# Patient Record
Sex: Male | Born: 1955 | Race: White | Hispanic: No | Marital: Married | Smoking: Former smoker
Health system: Southern US, Community
[De-identification: ages and names within clinical notes are randomized; demographics above are authoritative.]

## PROBLEM LIST (undated history)

## (undated) DIAGNOSIS — N200 Calculus of kidney: Secondary | ICD-10-CM

## (undated) DIAGNOSIS — K529 Noninfective gastroenteritis and colitis, unspecified: Secondary | ICD-10-CM

## (undated) DIAGNOSIS — K4090 Unilateral inguinal hernia, without obstruction or gangrene, not specified as recurrent: Secondary | ICD-10-CM

## (undated) DIAGNOSIS — Z8489 Family history of other specified conditions: Secondary | ICD-10-CM

## (undated) DIAGNOSIS — E785 Hyperlipidemia, unspecified: Secondary | ICD-10-CM

## (undated) DIAGNOSIS — M47812 Spondylosis without myelopathy or radiculopathy, cervical region: Secondary | ICD-10-CM

## (undated) DIAGNOSIS — M1990 Unspecified osteoarthritis, unspecified site: Secondary | ICD-10-CM

## (undated) DIAGNOSIS — M47816 Spondylosis without myelopathy or radiculopathy, lumbar region: Secondary | ICD-10-CM

## (undated) DIAGNOSIS — E119 Type 2 diabetes mellitus without complications: Secondary | ICD-10-CM

## (undated) HISTORY — DX: Calculus of kidney: N20.0

## (undated) HISTORY — PX: BACK SURGERY: SHX140

## (undated) HISTORY — DX: Spondylosis without myelopathy or radiculopathy, lumbar region: M47.816

## (undated) HISTORY — DX: Spondylosis without myelopathy or radiculopathy, cervical region: M47.812

## (undated) HISTORY — PX: NECK SURGERY: SHX720

## (undated) HISTORY — DX: Type 2 diabetes mellitus without complications: E11.9

## (undated) HISTORY — PX: KIDNEY STONE SURGERY: SHX686

## (undated) HISTORY — DX: Noninfective gastroenteritis and colitis, unspecified: K52.9

## (undated) HISTORY — DX: Unspecified osteoarthritis, unspecified site: M19.90

## (undated) HISTORY — DX: Hyperlipidemia, unspecified: E78.5

## (undated) HISTORY — DX: Unilateral inguinal hernia, without obstruction or gangrene, not specified as recurrent: K40.90

---
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
## 1996-09-22 HISTORY — PX: CERVICAL FUSION: SHX112

## 1997-09-22 HISTORY — PX: SEPTOPLASTY: SUR1290

## 2001-01-20 DIAGNOSIS — M1990 Unspecified osteoarthritis, unspecified site: Secondary | ICD-10-CM | POA: Insufficient documentation

## 2004-05-23 ENCOUNTER — Encounter: Payer: Self-pay | Admitting: Family Medicine

## 2004-05-23 DIAGNOSIS — E785 Hyperlipidemia, unspecified: Secondary | ICD-10-CM

## 2004-05-23 HISTORY — DX: Hyperlipidemia, unspecified: E78.5

## 2004-05-23 LAB — CONVERTED CEMR LAB: PSA: 0.3 ng/mL

## 2004-08-22 ENCOUNTER — Encounter: Payer: Self-pay | Admitting: Family Medicine

## 2004-08-22 DIAGNOSIS — E119 Type 2 diabetes mellitus without complications: Secondary | ICD-10-CM | POA: Insufficient documentation

## 2004-08-22 HISTORY — DX: Type 2 diabetes mellitus without complications: E11.9

## 2004-08-22 LAB — CONVERTED CEMR LAB: Hgb A1c MFr Bld: 6.1 %

## 2004-09-09 ENCOUNTER — Ambulatory Visit: Payer: Self-pay | Admitting: Family Medicine

## 2004-09-11 ENCOUNTER — Ambulatory Visit: Payer: Self-pay | Admitting: Family Medicine

## 2004-10-15 ENCOUNTER — Ambulatory Visit: Payer: Self-pay | Admitting: Family Medicine

## 2004-11-20 ENCOUNTER — Encounter: Payer: Self-pay | Admitting: Family Medicine

## 2004-11-20 LAB — CONVERTED CEMR LAB: PSA: 0.35 ng/mL

## 2004-12-09 ENCOUNTER — Ambulatory Visit: Payer: Self-pay | Admitting: Family Medicine

## 2004-12-11 ENCOUNTER — Ambulatory Visit: Payer: Self-pay | Admitting: Family Medicine

## 2005-06-16 ENCOUNTER — Ambulatory Visit: Payer: Self-pay | Admitting: Family Medicine

## 2005-07-16 ENCOUNTER — Ambulatory Visit: Payer: Self-pay | Admitting: Family Medicine

## 2005-11-20 ENCOUNTER — Encounter: Payer: Self-pay | Admitting: Family Medicine

## 2005-11-20 LAB — CONVERTED CEMR LAB: PSA: 0.45 ng/mL

## 2005-12-08 ENCOUNTER — Ambulatory Visit: Payer: Self-pay | Admitting: Family Medicine

## 2005-12-15 ENCOUNTER — Ambulatory Visit: Payer: Self-pay | Admitting: Family Medicine

## 2006-01-14 ENCOUNTER — Ambulatory Visit: Payer: Self-pay | Admitting: Family Medicine

## 2006-02-02 ENCOUNTER — Ambulatory Visit: Payer: Self-pay | Admitting: Family Medicine

## 2006-02-05 ENCOUNTER — Ambulatory Visit: Payer: Self-pay | Admitting: Family Medicine

## 2006-03-09 ENCOUNTER — Ambulatory Visit: Payer: Self-pay | Admitting: Family Medicine

## 2006-03-16 ENCOUNTER — Ambulatory Visit: Payer: Self-pay | Admitting: Family Medicine

## 2006-04-27 ENCOUNTER — Ambulatory Visit: Payer: Self-pay | Admitting: Family Medicine

## 2006-05-12 ENCOUNTER — Ambulatory Visit: Payer: Self-pay | Admitting: Family Medicine

## 2006-06-23 ENCOUNTER — Ambulatory Visit: Payer: Self-pay | Admitting: Family Medicine

## 2006-08-07 ENCOUNTER — Ambulatory Visit: Payer: Self-pay | Admitting: Family Medicine

## 2006-08-11 ENCOUNTER — Ambulatory Visit: Payer: Self-pay | Admitting: Family Medicine

## 2006-11-21 ENCOUNTER — Encounter: Payer: Self-pay | Admitting: Family Medicine

## 2006-12-18 ENCOUNTER — Ambulatory Visit: Payer: Self-pay | Admitting: Family Medicine

## 2006-12-18 LAB — CONVERTED CEMR LAB
ALT: 26 units/L (ref 0–40)
AST: 19 units/L (ref 0–37)
Bilirubin, Direct: 0.1 mg/dL (ref 0.0–0.3)
Calcium: 9.2 mg/dL (ref 8.4–10.5)
Chloride: 106 meq/L (ref 96–112)
Cholesterol: 114 mg/dL (ref 0–200)
GFR calc non Af Amer: 84 mL/min
Glucose, Bld: 127 mg/dL — ABNORMAL HIGH (ref 70–99)
Hgb A1c MFr Bld: 6.3 % — ABNORMAL HIGH (ref 4.6–6.0)
LDL Cholesterol: 65 mg/dL (ref 0–99)
PSA: 0.21 ng/mL (ref 0.10–4.00)

## 2006-12-21 ENCOUNTER — Ambulatory Visit: Payer: Self-pay | Admitting: Family Medicine

## 2006-12-22 ENCOUNTER — Encounter: Payer: Self-pay | Admitting: Family Medicine

## 2007-01-04 ENCOUNTER — Ambulatory Visit: Payer: Self-pay | Admitting: Family Medicine

## 2007-06-07 ENCOUNTER — Encounter: Payer: Self-pay | Admitting: Family Medicine

## 2007-06-18 ENCOUNTER — Ambulatory Visit: Payer: Self-pay | Admitting: Internal Medicine

## 2007-06-21 LAB — CONVERTED CEMR LAB: Hgb A1c MFr Bld: 6.5 % — ABNORMAL HIGH (ref 4.6–6.0)

## 2007-06-29 ENCOUNTER — Ambulatory Visit: Payer: Self-pay | Admitting: Family Medicine

## 2007-09-21 ENCOUNTER — Ambulatory Visit: Payer: Self-pay | Admitting: Family Medicine

## 2007-09-23 HISTORY — PX: FOOT SURGERY: SHX648

## 2007-12-27 ENCOUNTER — Ambulatory Visit: Payer: Self-pay | Admitting: Family Medicine

## 2007-12-27 LAB — CONVERTED CEMR LAB
ALT: 33 units/L (ref 0–53)
BUN: 15 mg/dL (ref 6–23)
CO2: 29 meq/L (ref 19–32)
Calcium: 9.2 mg/dL (ref 8.4–10.5)
Cholesterol: 116 mg/dL (ref 0–200)
Creatinine, Ser: 1 mg/dL (ref 0.4–1.5)
Creatinine,U: 21.1 mg/dL
Glucose, Bld: 120 mg/dL — ABNORMAL HIGH (ref 70–99)
HDL: 32.7 mg/dL — ABNORMAL LOW (ref >39.0)
Microalb, Ur: 0.2 mg/dL (ref 0.0–1.9)
TSH: 1.24 microintl units/mL (ref 0.35–5.50)
Total Protein: 6.4 g/dL (ref 6.0–8.3)
Triglycerides: 52 mg/dL (ref 0–149)

## 2007-12-29 ENCOUNTER — Ambulatory Visit: Payer: Self-pay | Admitting: Family Medicine

## 2008-01-20 ENCOUNTER — Ambulatory Visit: Payer: Self-pay | Admitting: Family Medicine

## 2008-01-20 ENCOUNTER — Encounter (INDEPENDENT_AMBULATORY_CARE_PROVIDER_SITE_OTHER): Payer: Self-pay | Admitting: *Deleted

## 2008-01-20 LAB — CONVERTED CEMR LAB
OCCULT 2: NEGATIVE
OCCULT 3: NEGATIVE

## 2008-06-07 LAB — HM DIABETES EYE EXAM: HM Diabetic Eye Exam: NORMAL

## 2008-06-16 ENCOUNTER — Ambulatory Visit: Payer: Self-pay | Admitting: Family Medicine

## 2008-07-12 ENCOUNTER — Ambulatory Visit: Payer: Self-pay | Admitting: Family Medicine

## 2008-07-12 LAB — CONVERTED CEMR LAB: Hgb A1c MFr Bld: 6.3 % — ABNORMAL HIGH (ref 4.6–6.0)

## 2008-07-19 ENCOUNTER — Ambulatory Visit: Payer: Self-pay | Admitting: Family Medicine

## 2008-08-29 ENCOUNTER — Ambulatory Visit: Payer: Self-pay | Admitting: Family Medicine

## 2008-08-30 ENCOUNTER — Telehealth (INDEPENDENT_AMBULATORY_CARE_PROVIDER_SITE_OTHER): Payer: Self-pay | Admitting: *Deleted

## 2008-10-02 ENCOUNTER — Ambulatory Visit: Payer: Self-pay | Admitting: Family Medicine

## 2009-01-10 ENCOUNTER — Ambulatory Visit: Payer: Self-pay | Admitting: Family Medicine

## 2009-01-10 LAB — CONVERTED CEMR LAB
ALT: 31 units/L (ref 0–53)
AST: 23 units/L (ref 0–37)
Alkaline Phosphatase: 51 units/L (ref 39–117)
Basophils Relative: 0 % (ref 0.0–3.0)
Bilirubin, Direct: 0 mg/dL (ref 0.0–0.3)
Chloride: 109 meq/L (ref 96–112)
Creatinine, Ser: 0.9 mg/dL (ref 0.4–1.5)
Creatinine,U: 22.1 mg/dL
Eosinophils Relative: 3.4 % (ref 0.0–5.0)
GFR calc non Af Amer: 93.46 mL/min (ref >60)
LDL Cholesterol: 68 mg/dL (ref 0–99)
Lymphocytes Relative: 29.5 % (ref 12.0–46.0)
MCV: 93.4 fL (ref 78.0–100.0)
Microalb Creat Ratio: 9 mg/g (ref 0.0–30.0)
Monocytes Absolute: 0.5 10*3/uL (ref 0.1–1.0)
Monocytes Relative: 8.9 % (ref 3.0–12.0)
Neutrophils Relative %: 58.2 % (ref 43.0–77.0)
Platelets: 247 10*3/uL (ref 150.0–400.0)
RBC: 4.58 M/uL (ref 4.22–5.81)
Total Bilirubin: 0.7 mg/dL (ref 0.3–1.2)
Total CHOL/HDL Ratio: 3
Total Protein: 6.7 g/dL (ref 6.0–8.3)
Triglycerides: 63 mg/dL (ref 0.0–149.0)
VLDL: 12.6 mg/dL (ref 0.0–40.0)
WBC: 5.4 10*3/uL (ref 4.5–10.5)

## 2009-01-15 ENCOUNTER — Ambulatory Visit: Payer: Self-pay | Admitting: Family Medicine

## 2009-01-15 LAB — HM DIABETES FOOT EXAM

## 2009-01-25 ENCOUNTER — Ambulatory Visit: Payer: Self-pay | Admitting: Family Medicine

## 2009-01-25 LAB — FECAL OCCULT BLOOD, GUAIAC: Fecal Occult Blood: NEGATIVE

## 2009-01-25 LAB — CONVERTED CEMR LAB: OCCULT 3: NEGATIVE

## 2009-01-29 ENCOUNTER — Encounter (INDEPENDENT_AMBULATORY_CARE_PROVIDER_SITE_OTHER): Payer: Self-pay | Admitting: *Deleted

## 2009-07-17 ENCOUNTER — Ambulatory Visit: Payer: Self-pay | Admitting: Family Medicine

## 2009-07-17 LAB — CONVERTED CEMR LAB: Hgb A1c MFr Bld: 6.2 % (ref 4.6–6.5)

## 2009-07-24 ENCOUNTER — Ambulatory Visit: Payer: Self-pay | Admitting: Family Medicine

## 2010-01-29 ENCOUNTER — Ambulatory Visit: Payer: Self-pay | Admitting: Family Medicine

## 2010-04-30 ENCOUNTER — Encounter (INDEPENDENT_AMBULATORY_CARE_PROVIDER_SITE_OTHER): Payer: Self-pay | Admitting: *Deleted

## 2010-06-27 ENCOUNTER — Telehealth (INDEPENDENT_AMBULATORY_CARE_PROVIDER_SITE_OTHER): Payer: Self-pay | Admitting: *Deleted

## 2010-06-27 ENCOUNTER — Ambulatory Visit: Payer: Self-pay | Admitting: Family Medicine

## 2010-06-27 LAB — CONVERTED CEMR LAB
AST: 21 units/L (ref 0–37)
Albumin: 4.2 g/dL (ref 3.5–5.2)
Alkaline Phosphatase: 52 units/L (ref 39–117)
Basophils Relative: 0.4 % (ref 0.0–3.0)
Bilirubin, Direct: 0.1 mg/dL (ref 0.0–0.3)
CO2: 29 meq/L (ref 19–32)
Chloride: 102 meq/L (ref 96–112)
Creatinine, Ser: 1 mg/dL (ref 0.4–1.5)
Eosinophils Absolute: 0.2 10*3/uL (ref 0.0–0.7)
HCT: 42.8 % (ref 39.0–52.0)
Hemoglobin: 14.6 g/dL (ref 13.0–17.0)
Hgb A1c MFr Bld: 7 % — ABNORMAL HIGH (ref 4.6–6.5)
MCHC: 34.2 g/dL (ref 30.0–36.0)
MCV: 94.3 fL (ref 78.0–100.0)
Microalb Creat Ratio: 0.9 mg/g (ref 0.0–30.0)
Monocytes Absolute: 0.6 10*3/uL (ref 0.1–1.0)
Neutrophils Relative %: 59.2 % (ref 43.0–77.0)
PSA: 0.39 ng/mL (ref 0.10–4.00)
Platelets: 249 10*3/uL (ref 150.0–400.0)
Potassium: 4.6 meq/L (ref 3.5–5.1)
Sodium: 138 meq/L (ref 135–145)
Total Bilirubin: 0.6 mg/dL (ref 0.3–1.2)
Total CHOL/HDL Ratio: 3
Triglycerides: 83 mg/dL (ref 0.0–149.0)
WBC: 6.6 10*3/uL (ref 4.5–10.5)

## 2010-07-03 ENCOUNTER — Encounter (INDEPENDENT_AMBULATORY_CARE_PROVIDER_SITE_OTHER): Payer: Self-pay | Admitting: *Deleted

## 2010-07-03 ENCOUNTER — Ambulatory Visit: Payer: Self-pay | Admitting: Family Medicine

## 2010-07-03 DIAGNOSIS — K649 Unspecified hemorrhoids: Secondary | ICD-10-CM | POA: Insufficient documentation

## 2010-07-09 ENCOUNTER — Encounter (INDEPENDENT_AMBULATORY_CARE_PROVIDER_SITE_OTHER): Payer: Self-pay | Admitting: *Deleted

## 2010-07-10 ENCOUNTER — Ambulatory Visit: Payer: Self-pay | Admitting: Gastroenterology

## 2010-07-10 ENCOUNTER — Encounter (INDEPENDENT_AMBULATORY_CARE_PROVIDER_SITE_OTHER): Payer: Self-pay | Admitting: *Deleted

## 2010-07-17 ENCOUNTER — Ambulatory Visit: Payer: Self-pay | Admitting: Gastroenterology

## 2010-09-30 ENCOUNTER — Encounter: Payer: Self-pay | Admitting: Family Medicine

## 2010-09-30 ENCOUNTER — Ambulatory Visit
Admission: RE | Admit: 2010-09-30 | Discharge: 2010-09-30 | Payer: Self-pay | Source: Home / Self Care | Attending: Family Medicine | Admitting: Family Medicine

## 2010-09-30 ENCOUNTER — Ambulatory Visit: Payer: Self-pay | Admitting: Family Medicine

## 2010-09-30 DIAGNOSIS — M5137 Other intervertebral disc degeneration, lumbosacral region: Secondary | ICD-10-CM | POA: Insufficient documentation

## 2010-10-22 NOTE — Letter (Signed)
Summary: Gary Everett Instructions  Gary Everett  [address] [place], [place] [address]   Phone: [phone]  Fax: [phone]       Gary Everett    Oct 07, 1955    MRN: [id number]        Procedure Day Dorna Bloom:  Gary Everett  07/17/10     Arrival Time:  8:00AM     Procedure Time:  9:00AM     Location of Procedure:                    Gary Everett  Gary Everett (4th Floor)                       PREPARATION FOR COLONOSCOPY WITH MOVIPREP   Starting 5 days prior to your procedure 07/12/10 do not eat nuts, seeds, popcorn, corn, beans, peas,  salads, or any raw vegetables.  Do not take any fiber supplements (e.g. Metamucil, Citrucel, and Benefiber).  THE DAY BEFORE YOUR PROCEDURE         DATE: 07/16/10  DAY: TUESDAY  1.  Drink clear liquids the entire day-NO SOLID FOOD  2.  Do not drink anything colored red or purple.  Avoid juices with pulp.  No orange juice.  3.  Drink at least 64 oz. (8 glasses) of fluid/clear liquids during the day to prevent dehydration and help the prep work efficiently.  CLEAR LIQUIDS INCLUDE: Water Jello Ice Popsicles Tea (sugar ok, no milk/cream) Powdered fruit flavored drinks Coffee (sugar ok, no milk/cream) Gatorade Juice: apple, white grape, white cranberry  Lemonade Clear bullion, consomm, broth Carbonated beverages (any kind) Strained chicken noodle soup Hard Candy                             4.  In the morning, mix first dose of MoviPrep solution:    Empty 1 Pouch A and 1 Pouch B into the disposable container    Add lukewarm drinking water to the top line of the container. Mix to dissolve    Refrigerate (mixed solution should be used within 24 hrs)  5.  Begin drinking the prep at 5:00 p.m. The MoviPrep container is divided by 4 marks.   Every 15 minutes drink the solution down to the next mark (approximately 8 oz) until the full liter is complete.   6.  Follow completed prep with 16 oz of clear liquid of your choice (Nothing  red or purple).  Continue to drink clear liquids until bedtime.  7.  Before going to bed, mix second dose of MoviPrep solution:    Empty 1 Pouch A and 1 Pouch B into the disposable container    Add lukewarm drinking water to the top line of the container. Mix to dissolve    Refrigerate  THE DAY OF YOUR PROCEDURE      DATE: 07/17/10   DAY: WEDNESDAY  Beginning at 4:00AM (5 hours before procedure):         1. Every 15 minutes, drink the solution down to the next mark (approx 8 oz) until the full liter is complete.  2. Follow completed prep with 16 oz. of clear liquid of your choice.    3. You may drink clear liquids until 7:00AM (2 HOURS BEFORE PROCEDURE).   MEDICATION INSTRUCTIONS  Unless otherwise instructed, you should take regular prescription medications with a small sip of water   as early as possible the morning  of your procedure.  See separate diabetic instructions:          OTHER INSTRUCTIONS  You will need a responsible adult at least 56 years of age to accompany you and drive you home.   This person must remain in the waiting room during your procedure.  Wear loose fitting clothing that is easily removed.  Leave jewelry and other valuables at home.  However, you may wish to bring a book to read or  an iPod/MP3 player to listen to music as you wait for your procedure to start.  Remove all body piercing jewelry and leave at home.  Total time from sign-in until discharge is approximately 2-3 hours.  You should go home directly after your procedure and rest.  You can resume normal activities the  day after your procedure.  The day of your procedure you should not:   Drive   Make legal decisions   Operate machinery   Drink alcohol   Return to work  You will receive specific instructions about eating, activities and medications before you leave.    The above instructions have been reviewed and explained to me by   Gary Almas RN  July 10, 2010 3:58 PM     I fully understand and can verbalize these instructions _____________________________ Date _________

## 2010-10-22 NOTE — Miscellaneous (Signed)
Summary: [facility] Previsit/prep  Clinical Lists Changes  Medications: Added new medication of MOVIPREP 100 GM  SOLR (PEG-KCL-NACL-NASULF-NA ASC-C) As per prep instructions. - Signed Rx of MOVIPREP 100 GM  SOLR (PEG-KCL-NACL-NASULF-NA ASC-C) As per prep instructions.;  #1 x 0;  Signed;  Entered by: Wyona Almas RN;  Authorized by: Mardella Layman MD [facility];  Method used: Electronically to [facility]. [facility] [phone]*, [address] [place], [place]  [id number], Ph: [phone], Fax: [phone] Allergies: Changed allergy or adverse reaction from * IVP DYE to * IVP DYE    Prescriptions: MOVIPREP 100 GM  SOLR (PEG-KCL-NACL-NASULF-NA ASC-C) As per prep instructions.  #1 x 0   Entered by:   Wyona Almas RN   Authorized by:   Mardella Layman MD [facility]   Signed by:   Wyona Almas RN on 07/10/2010   Method used:   Electronically to        [facility]. [address] [phone]* (retail)       [address] [place], [place]  [id number]       Ph: [phone]       Fax: [phone]   RxID:   [id number]

## 2010-10-22 NOTE — Assessment & Plan Note (Signed)
Summary: ROA 6 MTHS  CYD   Vital Signs:  Patient profile:   55 year old male Weight:      178.75 pounds BMI:     28.10 Temp:     97.8 degrees F oral Pulse rate:   72 / minute Pulse rhythm:   regular BP sitting:   110 / 64  (left arm) Cuff size:   regular  Vitals Entered By: Sydell Axon LPN (Jan 29, 2010 8:07 AM) CC: 6 Month follow-up, had some swelling and soreness in left breast area last week, better now   History of Present Illness: Pt here for 6 month followup. Last week he had some swelling around the areola of the left breast and responded to heat therapy and has now resolved. Sugar runs 137-114, higher in morning. He sees he thinks increase in glu in AM if snacks at night. These nos are stable. He feels well today with no complaints.  Problems Prior to Update: 1)  Special Screening Malig Neoplasms Other Sites  (ICD-V76.49) 2)  Health Maintenance Exam  (ICD-V70.0) 3)  Special Screening Malignant Neoplasm of Prostate  (ICD-V76.44) 4)  Osteoarthrosis Nos, Unspecified Site  (ICD-715.90) 5)  Nicotine Addiction  (ICD-305.1) 6)  Diabetes Mellitus, Type II  (ICD-250.00) 7)  Hyperlipidemia  (ICD-272.4)  Medications Prior to Update: 1)  Simvastatin 40 Mg  Tabs (Simvastatin) .... Take 1 Tablet By Mouth At Bedtime 2)  Tylenol Ex St Arthritis Pain 500 Mg  Tabs (Acetaminophen) .... As Needed 3)  Metformin Hcl 500 Mg  Tabs (Metformin Hcl) .... One Tab By Mouth At Night. 4)  Accu-Chek Comfort Curve  Strp (Glucose Blood) .... Check Daily As Directed  Allergies: 1)  ! * Ivp Dye  Physical Exam  General:  Well-developed,well-nourished,in no acute distress; alert,appropriate and cooperative throughout examination Head:  Normocephalic and atraumatic without obvious abnormalities. No apparent alopecia but mild  balding. Eyes:  Conjunctiva clear bilaterally.  Ears:  External ear exam shows no significant lesions or deformities.  Otoscopic examination reveals clear canals, tympanic  membranes are intact bilaterally without bulging, retraction, inflammation or discharge. Hearing is grossly normal bilaterally. Nose:  External nasal examination shows no deformity or inflammation. Nasal mucosa are pink and moist without lesions or exudates. Mouth:  Oral mucosa and oropharynx without lesions or exudates.  Teeth in good repair. Neck:  No deformities, masses, or tenderness noted. Lungs:  Normal respiratory effort, chest expands symmetrically. Lungs are clear to auscultation, no crackles or wheezes. Heart:  Normal rate and regular rhythm. S1 and S2 normal without gallop, murmur, click, rub or other extra sounds.   Impression & Recommendations:  Problem # 1:  DIABETES MELLITUS, TYPE II (ICD-250.00) Assessment Unchanged  Stable. Will follow. Cont curr meds. His updated medication list for this problem includes:    Metformin Hcl 500 Mg Tabs (Metformin hcl) ..... One tab by mouth at night.  Labs Reviewed: Creat: 0.9 (01/10/2009)   Microalbumin: 6.5 (11/20/2005)  Last Eye Exam: normal (06/07/2008) Reviewed HgBA1c results: 6.2 (07/17/2009)  6.3 (01/10/2009)  Complete Medication List: 1)  Simvastatin 40 Mg Tabs (Simvastatin) .... Take 1 tablet by mouth at bedtime 2)  Tylenol Ex St Arthritis Pain 500 Mg Tabs (Acetaminophen) .... As needed 3)  Metformin Hcl 500 Mg Tabs (Metformin hcl) .... One tab by mouth at night. 4)  Accu-chek Comfort Curve Strp (Glucose blood) .... Check daily as directed  Patient Instructions: 1)  RTC 10/11 for Comp Exam, labs prior. Call the end of the month.  Current Allergies (reviewed today): ! * IVP DYE

## 2010-10-22 NOTE — Procedures (Signed)
Summary: Colonoscopy  Patient: Gary Everett Note: All result statuses are Final unless otherwise noted.  Tests: (1) Colonoscopy (COL)   COL Colonoscopy           DONE (C)     [facility]     [address]     [place], [place]  [address]           COLONOSCOPY PROCEDURE REPORT           PATIENT:  [name], [name]  MR#:  [id number]     BIRTHDATE:  January 10, 1956, 54 yrs. old  GENDER:  male     ENDOSCOPIST:  Vania Rea. Jarold Motto, MD, [facility]     REF. BY:  Laurita Quint, M.D.     PROCEDURE DATE:  07/17/2010     PROCEDURE:  Average-risk screening colonoscopy     G0121     ASA CLASS:  Class II     INDICATIONS:  Routine Risk Screening     MEDICATIONS:   Fentanyl 50 mcg IV, Versed 6 mg IV           DESCRIPTION OF PROCEDURE:   After the risks benefits and     alternatives of the procedure were thoroughly explained, informed     consent was obtained.  Digital rectal exam was performed and     revealed no abnormalities.   The LB [id number] endoscope     was introduced through the anus and advanced to the cecum, which     was identified by both the appendix and ileocecal valve, without     limitations.  The quality of the prep was excellent, using     MoviPrep.  The instrument was then slowly withdrawn as the colon     was fully examined.     <<PROCEDUREIMAGES>>           FINDINGS:  Scattered diverticula were found in the sigmoid colon.     No polyps or cancers were seen.  This was otherwise a normal     examination of the colon.   Retroflexed views in the rectum     revealed no abnormalities.    The scope was then withdrawn from     the patient and the procedure completed.           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     REPEAT EXAM:  No           ______________________________     Vania Rea. Jarold Motto, MD, [facility]           CC:           n.     REVISED:  07/17/2010 12:00 PM     eSIGNED:   Vania Rea. Lenox Ladouceur at 07/17/2010 12:00 PM           [place], [place], [id number]  Note: An exclamation mark (!) indicates a result that was not dispersed into the flowsheet. Document Creation Date: 07/17/2010 12:03 PM _______________________________________________________________________  (1) Order result status: Final Collection or observation date-time: 07/17/2010 09:20 Requested date-time:  Receipt date-time:  Reported date-time:  Referring Physician:   Ordering Physician: Sheryn Bison [phone]) Specimen Source:  Source: Launa Grill Order Number: [id number] Lab site:   Appended Document: Colonoscopy    Clinical Lists Changes  Observations: Added new observation of COLONNXTDUE: 06/2020 (07/17/2010 12:51)      Appended Document: Colonoscopy     Clinical Lists Changes  Observations: Added new observation of PAST SURG HX: CERVICAL FUSION 4/5/6  (Dr Channing Mutters) 1998 SEPTOPLASTY [facility]) 1999 COLONOSCOPY Scattered Divertics Sigmoid No Polyps (Dr Jarold Motto) 07/17/2010        10 yrs (07/17/2010 13:42)       Past Surgical History:    CERVICAL FUSION 4/5/6  (Dr Channing Mutters) 1998    SEPTOPLASTY [facility]) 1999    COLONOSCOPY Scattered Divertics Sigmoid No Polyps (Dr Jarold Motto) 07/17/2010        9 yrs

## 2010-10-22 NOTE — Letter (Signed)
Summary: Pre Visit Letter Revised  [facility]  [address] [place], [place] [address]   Phone: [phone]  Fax: [phone]        07/03/2010 MRN: [id number] Gary Everett [address] [place], [place]  [address]             Procedure Date:  07/17/2010   Welcome to the Gastroenterology Division at [facility].    You are scheduled to see a nurse for your pre-procedure visit on 07/10/2010 at 3:30PM on the 3rd floor at [facility], [address]  We ask that you try to arrive at our office 15 minutes prior to your appointment time to allow for check-in.  Please take a minute to review the attached form.  If you answer "Yes" to one or more of the questions on the first page, we ask that you call the person listed at your earliest opportunity.  If you answer "No" to all of the questions, please complete the rest of the form and bring it to your appointment.    Your nurse visit will consist of discussing your medical and surgical history, your immediate family medical history, and your medications.   If you are unable to list all of your medications on the form, please bring the medication bottles to your appointment and we will list them.  We will need to be aware of both prescribed and over the counter drugs.  We will need to know exact dosage information as well.    Please be prepared to read and sign documents such as consent forms, a financial agreement, and acknowledgement forms.  If necessary, and with your consent, a friend or relative is welcome to sit-in on the nurse visit with you.  Please bring your insurance card so that we may make a copy of it.  If your insurance requires a referral to see a specialist, please bring your referral form from your primary care physician.  No co-pay is required for this nurse visit.     If you cannot keep your appointment, please call [phone] to cancel or reschedule prior to your appointment date.  This allows [place]  the opportunity to schedule an appointment for another patient in need of care.    Thank you for choosing [facility] for your medical needs.  We appreciate the opportunity to care for you.  Please visit [place] at our website  to learn more about our practice.  Sincerely, The Gastroenterology Division

## 2010-10-22 NOTE — Progress Notes (Signed)
----   Converted from flag ---- ---- 06/27/2010 7:09 AM, Shaune Leeks MD wrote: BMET A1C 250.00 PSA V76.44 CBC 715.90 CHOL PROF HEPATIC TSH 272.4    ---- 06/26/2010 11:45 AM, Liane Comber CMA (AAMA) wrote: Lab orders please! Good Morning! This pt is scheduled for cpx labs tomorrow, which labs to draw and dx codes to use? Thanks Tasha ------------------------------

## 2010-10-22 NOTE — Progress Notes (Signed)
----   Converted from flag ---- ---- 06/27/2010 7:11 AM, Shaune Leeks MD wrote: PLS ADD MICROALB TO LABS 250.00 ------------------------------

## 2010-10-22 NOTE — Letter (Signed)
Summary: Gary Everett letter  [place] [facility]  [address] [place], [place] [address]   Phone: [phone]  Fax: [phone]       04/30/2010 MRN: [id number]  Gary Everett [address] [place], [place]  [address]  Dear [name],  [facility], and [place] announce the retirement of Arta Silence, M.D., from full-time practice at the [facility] office effective March 21, 2010 and his plans of returning part-time.  It is important to Dr. Hetty Ely and to our practice that you understand that [facility] has seven physicians in our office for your health care needs.  We will continue to offer the same exceptional care that you have today.    Dr. Hetty Ely has spoken to many of you about his plans for retirement and returning part-time in the fall.   We will continue to work with you through the transition to schedule appointments for you in the office and meet the high standards that [place] is committed to.   Again, it is with great pleasure that we share the news that Dr. Hetty Ely will return to [facility] in October of 2011 with a reduced schedule.    If you have any questions, or would like to request an appointment with one of our physicians, please call us at [phone] and press the option for Scheduling an appointment.  We take pleasure in providing you with excellent patient care and look forward to seeing you at your next office visit.  Our [facility] Physicians are:  Tillman Abide, M.D. Laurita Quint, M.D. Roxy Manns, M.D. Kerby Nora, M.D. Hannah Beat, M.D. Ruthe Mannan, M.D. We proudly welcomed Raechel Ache, M.D. and Eustaquio Boyden, M.D. to the practice in July/August 2011.  Sincerely,  [facility]

## 2010-10-22 NOTE — Letter (Signed)
Summary: Diabetic Instructions  [facility]  [address] [place], [place] [address]   Phone: [phone]  Fax: [phone]    Gary Everett 1956/01/22 MRN: [id number]   _ x _   ORAL DIABETIC MEDICATION INSTRUCTIONS  The day before your procedure:   Take your diabetic pill as you do normally  The day of your procedure:   Do not take your diabetic pill    We will check your blood sugar levels during the admission process and again in Recovery before discharging you home  ________________________________________________________________________

## 2010-10-22 NOTE — Assessment & Plan Note (Signed)
Summary: cpx/dlo   Vital Signs:  Patient profile:   55 year old male Weight:      183 pounds Temp:     98.5 degrees F oral Pulse rate:   84 / minute Pulse rhythm:   regular BP sitting:   112 / 72  (left arm) Cuff size:   regular  Vitals Entered By: Sydell Axon LPN (July 03, 2010 8:25 AM) CC: 30 Minute checkup, had flu shot yesterday   History of Present Illness: Pt here for Comp Exam. He is looking forward to retirement 12/31. He is having no further problems with his left breast. He is having no problems and feels well. He has difficulty eating at regular times...discussed eating regularly multiple times a day. He generally eats twice a day.  Preventive Screening-Counseling & Management  Alcohol-Tobacco     Alcohol drinks/day: rare beer a year.     Smoking Status: current     Packs/Day: 1     Year Started: 1972     Passive Smoke Exposure: no  Caffeine-Diet-Exercise     Caffeine use/day: 5+     Does Patient Exercise: yes     Type of exercise: walks and rides bike some.     Exercise (avg: min/session): <30     Times/week: 7  Problems Prior to Update: 1)  Special Screening Malig Neoplasms Other Sites  (ICD-V76.49) 2)  Health Maintenance Exam  (ICD-V70.0) 3)  Special Screening Malignant Neoplasm of Prostate  (ICD-V76.44) 4)  Osteoarthrosis Nos, Unspecified Site  (ICD-715.90) 5)  Nicotine Addiction  (ICD-305.1) 6)  Diabetes Mellitus, Type II  (ICD-250.00) 7)  Hyperlipidemia  (ICD-272.4)  Medications Prior to Update: 1)  Simvastatin 40 Mg  Tabs (Simvastatin) .... Take 1 Tablet By Mouth At Bedtime 2)  Tylenol Ex St Arthritis Pain 500 Mg  Tabs (Acetaminophen) .... As Needed 3)  Metformin Hcl 500 Mg  Tabs (Metformin Hcl) .... One Tab By Mouth At Night. 4)  Accu-Chek Comfort Curve  Strp (Glucose Blood) .... Check Daily As Directed  Allergies: 1)  ! 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 (07/03/2010) Caffeine Use: 5+ (07/03/2010) Exercise: yes (07/03/2010)  Risk Factors: Smoking Status: current (07/03/2010) Packs/Day: 1 (07/03/2010) Passive Smoke Exposure: no (07/03/2010)  Family History: Father dec 44 Suicide  Mother dec Dm Renal insuff CAD COPD PVD Brother A 31  Htn Brother A 47 Neck Operation Sister A 59 Breathing Probs (smoker) Sister A 36 Lupus Sister A 40 Sister A 51 Bipolar/schizo  Social History: Does Patient Exercise:  yes  Review of Systems General:  Denies chills, fatigue, fever, sweats, weakness, and weight loss; legs get tired, told to walk. Eyes:  Denies blurring, discharge, eye pain, and itching. ENT:  Denies decreased hearing, difficulty swallowing, earache, and ringing in ears. CV:  Denies chest pain or discomfort, fainting, fatigue, palpitations, shortness of breath with exertion, swelling of feet, and swelling of hands. Resp:  Denies cough, shortness  of breath, and wheezing. GI:  Complains of bloody stools and hemorrhoids; denies abdominal pain, change in bowel habits, constipation, dark tarry stools, diarrhea, indigestion, loss of appetite, nausea, vomiting, vomiting blood, and yellowish skin color; occas hemm irritation with peanuts.. GU:  Denies discharge, dysuria, nocturia, and urinary frequency. MS:  Denies joint pain, low back pain, muscle aches, and stiffness; occas numbness in th  left hip. Derm:   Denies dryness, itching, and rash. Neuro:  Complains of tingling; denies numbness, poor balance, and tremors; in gtoes at times from hip.  Physical Exam  General:  Well-developed,well-nourished,in no acute distress; alert,appropriate and cooperative throughout examination Head:  Normocephalic and atraumatic without obvious abnormalities. No apparent alopecia but mild  balding. Eyes:  Conjunctiva clear bilaterally.  Ears:  External ear exam shows no significant lesions or deformities.  Otoscopic examination reveals clear canals, tympanic membranes are intact bilaterally without bulging, retraction, inflammation or discharge. Hearing is grossly normal bilaterally. Nose:  External nasal examination shows no deformity or inflammation. Nasal mucosa are pink and moist without lesions or exudates. Mouth:  Oral mucosa and oropharynx without lesions or exudates.  Teeth in good repair. Neck:  No deformities, masses, or tenderness noted. Chest Wall:  No deformities, masses, tenderness or gynecomastia noted. Breasts:  No masses or gynecomastia noted Lungs:  Normal respiratory effort, chest expands symmetrically. Lungs are clear to auscultation, no crackles or wheezes. Heart:  Normal rate and regular rhythm. S1 and S2 normal without gallop, murmur, click, rub or other extra sounds. Abdomen:  Bowel sounds positive,abdomen soft and non-tender without masses, organomegaly or hernias noted. Rectal:  No external abnormalities noted. Normal sphincter tone. No rectal masses or tenderness. G neg Genitalia:  Testes bilaterally descended without nodularity, tenderness or masses. No scrotal masses or lesions. No penis lesions or urethral discharge. Prostate:  Prostate gland firm and smooth, no enlargement, nodularity, tenderness, mass, asymmetry or induration. 20gm. Msk:  L shoulder no impingement, ROM limited in lat ext , raising arm over shoulder height. No atrophy noted vs right shoulder. Pulses:  R and L  carotid,radial,femoral,dorsalis pedis and posterior tibial pulses are full and equal bilaterally Extremities:  No clubbing, cyanosis, edema, or deformity noted with normal full range of motion of all joints.   Neurologic:  No cranial nerve deficits noted. Station and gait are normal. Plantar reflexes are down-going bilaterally. DTRs are symmetrical throughout. Sensory, motor and coordinative functions appear intact. Skin:  Intact without suspicious lesions or rashes Cervical Nodes:  No lymphadenopathy noted Inguinal Nodes:  No significant adenopathy Psych:  normally interactive, good eye contact, and not anxious appearing.     Impression & Recommendations:  Problem # 1:  HEALTH MAINTENANCE EXAM (ICD-V70.0) Assessment Comment Only  Reviewed preventive care protocols, scheduled due services, and updated immunizations. Refer for colonoscopy due to presumed hemmorhoidal bleeding.  Problem # 2:  SPECIAL SCREENING MALIGNANT NEOPLASM OF PROSTATE (ICD-V76.44) Assessment: Unchanged PSA and exam nml.  Problem # 3:  OSTEOARTHROSIS NOS, UNSPECIFIED SITE (ICD-715.90) Assessment: Unchanged Middle left finger PIP joint. Try topical therapy. His updated medication list for this problem includes:    Tylenol Ex St Arthritis Pain 500 Mg Tabs (Acetaminophen) .Marland Kitchen... Take 2 by mouth two times a day as needed  Problem # 4:  NICOTINE ADDICTION (ICD-305.1) Assessment: Unchanged Encouraged to quit...goal for retirement.  Problem # 5:  HYPERLIPIDEMIA (ICD-272.4) Assessment: Unchanged Adequate control. More exercise will increase HDL. His updated medication list for this problem includes:    Simvastatin 40 Mg Tabs (Simvastatin) .Marland KitchenMarland KitchenMarland KitchenMarland Kitchen  Take 1 tablet by mouth at bedtime  Labs Reviewed: SGOT: 21 (06/27/2010)   SGPT: 29 (06/27/2010)   HDL:36.60 (06/27/2010), 36.90 (01/10/2009)  LDL:59 (06/27/2010), 68 (01/10/2009)  Chol:112 (06/27/2010), 117 (01/10/2009)  Trig:83.0 (06/27/2010), 63.0 (01/10/2009)  Problem #  6:  DIABETES MELLITUS, TYPE II (ICD-250.00) Assessment: Deteriorated Control has decreased some. He has been some noncompliant recently and can improve. He'll work on it. His updated medication list for this problem includes:    Metformin Hcl 500 Mg Tabs (Metformin hcl) ..... One tab by mouth at night.  Labs Reviewed: Creat: 1.0 (06/27/2010)   Microalbumin: 6.5 (11/20/2005)  Last Eye Exam: normal (06/07/2008) Reviewed HgBA1c results: 7.0 (06/27/2010)  6.2 (07/17/2009)  Complete Medication List: 1)  Simvastatin 40 Mg Tabs (Simvastatin) .... Take 1 tablet by mouth at bedtime 2)  Tylenol Ex St Arthritis Pain 500 Mg Tabs (Acetaminophen) .... Take 2 by mouth two times a day as needed 3)  Metformin Hcl 500 Mg Tabs (Metformin hcl) .... One tab by mouth at night. 4)  Accu-chek Comfort Curve Strp (Glucose blood) .... Check daily as directed  Other Orders: Gastroenterology Referral (GI)  Patient Instructions: 1)  Refer for colonoscopy to GI. 2)  RTC 6 mos, labs prior A1C and Bmet 250.00  Current Allergies (reviewed today): ! * IVP DYE   Influenza Immunization History:    Influenza # 1:  Historical (07/02/2010)

## 2010-10-24 NOTE — Assessment & Plan Note (Signed)
Summary: LOWER BACK OR KIDNEY STONE/EVM   Vital Signs:  Patient Profile:   55 Years Old Male CC:      Lower left back pain Height:     66 inches (170.82 cm) Weight:      178 pounds BMI:     28.83 O2 Sat:      96 % O2 treatment:    Room Air Temp:     97.0 degrees F oral Pulse rate:   85 / minute Pulse rhythm:   regular Resp:     20 per minute BP sitting:   127 / 82  (right arm)  Pt. in pain?   yes    Location:   back    Intensity:   5    Type:       aching  Vitals Entered By: Levonne Spiller EMT-P (September 30, 2010 11:58 AM)              Is Patient Diabetic? Yes   Does patient need assistance? Functional Status Self care Ambulation Normal Comments Pt is a smoker. Half pack every 2 days.      Current Allergies: ! * IVP DYEHistory of Present Illness History from: patient Reason for visit: see chief complaint Chief Complaint: Lower left back pain History of Present Illness: The patient is presenting today complaining of having lower back pain and discomfort.  He is saying that his symptoms started when he fell down 4 weeks ago. He reports that he stepped off a ladder in mid December and injured his back at that time. His symptoms initially calmed down but then reports that roughly 5 days ago he was moving bales of hay and ended up stepping off a trailer and he felt something pull in the lower back. He reports that the next morning he noticed his symptoms a little more. Then that evening he had a severe pain in the middle of the night that woke him up from a stent sleep with lower back pain that was severe. He reports that he took some Mobic tablets and the pain improved. He reports that he never lost control of bowel or bladder function. He reports that he has a tightness and stiffness in the lower back. He reports that sitting down aggravates the lower back pain. He reports that it radiates into the left hip but not into the leg. He has a history of problems with his lower back  and reports that if he drives or sits for long periods of time he has numbness in the feet. He has not been wearing his back brace because he says it caused blisters. The blisters were in the buttocks area. The patient reports that he's never had surgery on the lower back but he has had kidney stones removed with lithotripsy. He denies having blood in the urine or burning with urination.  REVIEW OF SYSTEMS Constitutional Symptoms      Denies fever, chills, night sweats, weight loss, weight gain, and fatigue.  Eyes       Denies change in vision, eye pain, eye discharge, glasses, contact lenses, and eye surgery. Ear/Nose/Throat/Mouth       Denies hearing loss/aids, change in hearing, ear pain, ear discharge, dizziness, frequent runny nose, frequent nose bleeds, sinus problems, sore throat, hoarseness, and tooth pain or bleeding.  Respiratory       Denies dry cough, productive cough, wheezing, shortness of breath, asthma, bronchitis, and emphysema/COPD.  Cardiovascular       Denies murmurs,  chest pain, and tires easily with exhertion.    Gastrointestinal       Denies stomach pain, nausea/vomiting, diarrhea, constipation, blood in bowel movements, and indigestion. Genitourniary       Complains of kidney stones.      Denies painful urination, blood or discharge from penis, and loss of urinary control.      Comments: Hx of Kidney Stones. Neurological       Denies paralysis, seizures, and fainting/blackouts. Musculoskeletal       Complains of muscle pain.      Denies joint pain, joint stiffness, redness, swelling, muscle weakness, and gout.      Comments: left lower back pain radiating into the left buttocks area; history of numbness in the right foot with sitting for prolonged periods of time.  See HPI. Skin       Denies bruising, unusual mles/lumps or sores, and hair/skin or nail changes.  Psych       Denies mood changes, temper/anger issues, anxiety/stress, speech problems, depression, and sleep  problems.  Past History:  Family History: Last updated: 07/03/2010 Father dec 44 Suicide  Mother dec Dm Renal insuff CAD COPD PVD Brother A 59  Htn Brother A 47 Neck Operation Sister A 75 Breathing Probs (smoker) Sister A 55 Lupus Sister A 8 Sister A 51 Bipolar/schizo  Social History: Last updated: 09/30/2010 Occupation:P[employer] [place] Married 1 child Drug use-no Regular exercise-yes  Risk Factors: Alcohol Use: rare beer a year. (07/03/2010) Caffeine Use: 5+ (07/03/2010) Exercise: yes (09/30/2010)  Risk Factors: Smoking Status: current (07/03/2010) Packs/Day: 1 (07/03/2010) Passive Smoke Exposure: no (07/03/2010)  Past Medical History: Hyperlipidemia (05/23/2004) Diabetes mellitus, type 2 (08/22/2004) DDD lumber spine DDD in cervical spine  Past Surgical History: Reviewed history from 07/17/2010 and no changes required. CERVICAL FUSION 4/5/6  (Dr Channing Mutters) 1998 [facility]) [facility] COLONOSCOPY Scattered Divertics Sigmoid No Polyps (Dr Jarold Motto) 07/17/2010        10 yrs  Family History: Reviewed history from 07/03/2010 and no changes required. 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 [employer] Married 1 child Drug use-no Regular exercise-yes Physical Exam General appearance: well developed, well nourished, no acute distress Head: normocephalic, atraumatic Eyes: conjunctivae and lids normal Pupils: equal, round, reactive to light Ears: normal, no lesions or deformities Nasal: mucosa pink, nonedematous, no septal deviation, turbinates normal Oral/Pharynx: tongue normal, posterior pharynx without erythema or exudate Neck: neck supple,  trachea midline, no masses Chest/Lungs: no rales, wheezes, or rhonchi bilateral, breath sounds equal without  effort Heart: regular rate and  rhythm, no murmur Abdomen: soft, non-tender without obvious organomegaly Extremities: normal extremities Neurological: grossly intact and non-focal Back: tender musculature left lower back,  straight leg raises negative bilaterally, deep tendon reflexes 2+ at achilles and patella Skin: no obvious rashes or lesions MSE: oriented to time, place, and person Assessment New Problems: 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 The risks, benefits and possible side effects were clearly explained and discussed with the patient.  The patient verbalized clear understanding.  The patient was given instructions to return if symptoms don't improve, worsen or new changes develop.  If it is not during clinic hours and the patient  cannot get back to this clinic then the patient was told to seek medical care at an available urgent care or emergency department.  The patient verbalized understanding.   Demonstrates willingness to comply.  Plan New Medications/Changes: DEXPAK 10 DAY 1.5 MG TABS (DEXAMETHASONE) take as directed  #1 x 0, 09/30/2010, Clanford Johnson MD CYCLOBENZAPRINE HCL 5 MG TABS (CYCLOBENZAPRINE HCL) take 1 by mouth every 8 hours as needed severe back spasms: Caution Will Cause Drowsiness  #15 x 0, 09/30/2010, Clanford Johnson MD HYDROCODONE-ACETAMINOPHEN 7.5-500 MG TABS (HYDROCODONE-ACETAMINOPHEN) take 1 by mouth every 6 hours as needed severe back pain  #30 x 0, 09/30/2010, Clanford Johnson MD  Planning Comments:   No heavy lifting of anything over 10 pounds for the next several weeks. Try not to sit for prolonged periods of time and if you do sit down please have a lumbar support at all times. You can use a rolled up pillow or purchase a lumbar support from a medical supply store. If your  symptoms worsen or if you develop symptoms of worsening back problems like loss of bowel or bladder control please go to the emergency department immediately.  The patient verbalized understanding. go to radiology and get sure arm x-rays done immediately and we will call you when we have the results available.  Follow Up: Follow up in 2-3 days if no improvement, Follow up on an as needed basis, Follow up with Primary Physician  The patient and/or caregiver has been counseled thoroughly with regard to medications prescribed including dosage, schedule, interactions, rationale for use, and possible side effects and they verbalize understanding.  Diagnoses and expected course of recovery discussed and will return if not improved as expected or if the condition worsens. Patient and/or caregiver verbalized understanding.  Prescriptions: DEXPAK 10 DAY 1.5 MG TABS (DEXAMETHASONE) take as directed  #1 x 0   Entered and Authorized by:   Standley Dakins MD   Signed by:   Standley Dakins MD on 09/30/2010   Method used:   Electronically to        [facility]. [address] [phone]* (retail)       [address] [place], [place]  [id number]       Ph: [phone]       Fax: [phone]   RxID:   [id number] CYCLOBENZAPRINE HCL 5 MG TABS (CYCLOBENZAPRINE HCL) take 1 by mouth every 8 hours as needed severe back spasms: Caution Will Cause Drowsiness  #15 x 0   Entered and Authorized by:   Standley Dakins MD   Signed by:   Standley Dakins MD on 09/30/2010   Method used:   Electronically to        [facility]. [address] [phone]* (retail)       [address] [place], [place]  [id number]       Ph: [phone]       Fax: [phone]   RxID:   [id number] HYDROCODONE-ACETAMINOPHEN 7.5-500 MG TABS (HYDROCODONE-ACETAMINOPHEN) take 1 by mouth every 6 hours as needed severe back pain  #30 x 0   Entered and Authorized by:   Standley Dakins MD   Signed by:   Standley Dakins MD on 09/30/2010    Method used:   Print then Give to Patient   RxID:   [id number]   Patient Instructions: 1)  Return or go to the ER if no improvement or symptoms getting worse.   2)  The risks, benefits and possible side effects were clearly explained and discussed with the patient.  The patient verbalized clear understanding.  The patient was given instructions to return if symptoms don't improve, worsen or new changes develop.  If it is not during clinic hours and the patient cannot get back to this clinic then the patient was told to seek medical care at an available urgent care or emergency department.  The patient verbalized understanding.   3)  No lifting over 10 pounds 4)  Try not to sit for long periods of time and if sitting use a lumbar support.  5)  The medications can cause drowsiness. Please do not operate a motor vehicle under the influence of the medications prescribed.  6)  OK to get back to moving about and daily activities except no heavy lifting and stop if you are having pain in the back.  Don't do anything that causes more back pain.  7)  If you get loss of bowel or bladder control go to the ER immediately. 8)  Go to get your xrays done.  Please call us at [phone] if you have not heard about your results in 3 days.    Appended Document: LOWER BACK OR KIDNEY STONE/EVM Please see scanned urinalysis results. Rodney Langton, MD, CDE, [employer]

## 2010-11-20 ENCOUNTER — Encounter: Payer: Self-pay | Admitting: Family Medicine

## 2010-12-04 LAB — GLUCOSE, CAPILLARY
Glucose-Capillary: 102 mg/dL — ABNORMAL HIGH (ref 70–99)
Glucose-Capillary: 109 mg/dL — ABNORMAL HIGH (ref 70–99)

## 2010-12-26 ENCOUNTER — Other Ambulatory Visit: Payer: Self-pay | Admitting: Family Medicine

## 2010-12-31 ENCOUNTER — Other Ambulatory Visit (INDEPENDENT_AMBULATORY_CARE_PROVIDER_SITE_OTHER): Payer: BLUE CROSS/BLUE SHIELD | Admitting: Family Medicine

## 2010-12-31 DIAGNOSIS — E119 Type 2 diabetes mellitus without complications: Secondary | ICD-10-CM

## 2010-12-31 LAB — BASIC METABOLIC PANEL
BUN: 16 mg/dL (ref 6–23)
Calcium: 9.5 mg/dL (ref 8.4–10.5)
Creatinine, Ser: 1 mg/dL (ref 0.4–1.5)
GFR: 85.1 mL/min (ref >60.00)

## 2011-01-08 ENCOUNTER — Encounter: Payer: Self-pay | Admitting: Family Medicine

## 2011-01-08 ENCOUNTER — Ambulatory Visit (INDEPENDENT_AMBULATORY_CARE_PROVIDER_SITE_OTHER): Payer: BLUE CROSS/BLUE SHIELD | Admitting: Family Medicine

## 2011-01-08 DIAGNOSIS — F17200 Nicotine dependence, unspecified, uncomplicated: Secondary | ICD-10-CM

## 2011-01-08 DIAGNOSIS — K5730 Diverticulosis of large intestine without perforation or abscess without bleeding: Secondary | ICD-10-CM

## 2011-01-08 DIAGNOSIS — K5790 Diverticulosis of intestine, part unspecified, without perforation or abscess without bleeding: Secondary | ICD-10-CM

## 2011-01-08 DIAGNOSIS — E119 Type 2 diabetes mellitus without complications: Secondary | ICD-10-CM

## 2011-01-08 NOTE — Assessment & Plan Note (Signed)
Enc to quit. Discussed friend of his who just unexpectedly died while smoking, DM and Htn, presumably from MI.

## 2011-01-08 NOTE — Patient Instructions (Signed)
RTC 6 mos, Comp Exam with labs before. Increase Metformin 500 twice a day and then incr to 500 in AM , 1000 PM if needed.

## 2011-01-08 NOTE — Progress Notes (Signed)
  Subjective:    Patient ID: Gary Everett, male    DOB: 21-Apr-1956, 55 y.o.   MRN: [id number]  HPI Pt here for 6 month followup. Had had worse Glu control last time felt to be from diet. He is now semiretired.  He has been more compliant...has cut back on cake, still has cake three times a week. During Christmas he was fairly loose. He had colonoscopy with only divertics.    Review of Systems  Constitutional: Negative for fever, chills, diaphoresis, activity change, appetite change and fatigue.  HENT: Negative for hearing loss, ear pain, congestion, sore throat, rhinorrhea, neck pain, neck stiffness, postnasal drip, sinus pressure, tinnitus and ear discharge.   Eyes: Negative for pain, discharge and visual disturbance.  Respiratory: Negative for cough, shortness of breath and wheezing.   Cardiovascular: Negative for chest pain and palpitations.       No SOB w/ exertion  Gastrointestinal:       No heartburn or swallowing problems.  Genitourinary:       No nocturia  Skin:       No itching or dryness.  Neurological:       No tingling or balance problems.  All other systems reviewed and are negative.       Objective:   Physical Exam  Constitutional: He appears well-developed and well-nourished. No distress.  HENT:  Head: Normocephalic and atraumatic.  Right Ear: External ear normal.  Left Ear: External ear normal.  Nose: Nose normal.  Mouth/Throat: Oropharynx is clear and moist.  Eyes: Conjunctivae and EOM are normal. Pupils are equal, round, and reactive to light. Right eye exhibits no discharge. Left eye exhibits no discharge.  Neck: Normal range of motion. Neck supple.  Cardiovascular: Normal rate and regular rhythm.   Pulmonary/Chest: Effort normal and breath sounds normal. He has no wheezes.  Lymphadenopathy:    He has no cervical adenopathy.  Skin: He is not diaphoretic.          Assessment & Plan:

## 2011-01-08 NOTE — Assessment & Plan Note (Signed)
Better. Enc good diet control. Incr Metformin to 500 bid and then add 500 more at night if no better nos after a month or two.

## 2011-01-08 NOTE — Assessment & Plan Note (Signed)
Discussed ..if has LLQ pain, come in.Marland Kitchen

## 2011-02-04 ENCOUNTER — Other Ambulatory Visit: Payer: Self-pay | Admitting: *Deleted

## 2011-02-04 MED ORDER — GLUCOSE BLOOD VI STRP: ORAL_STRIP | Status: DC

## 2011-02-04 NOTE — Telephone Encounter (Signed)
Patient is asking for written script for mail order. Please call when rx is ready.

## 2011-02-05 NOTE — Telephone Encounter (Signed)
Patient notified

## 2011-02-06 ENCOUNTER — Other Ambulatory Visit: Payer: Self-pay | Admitting: *Deleted

## 2011-02-06 MED ORDER — METFORMIN HCL 500 MG PO TABS: 500.0000 mg | ORAL_TABLET | Freq: Two times a day (BID) | ORAL | Status: DC

## 2011-02-06 MED ORDER — SIMVASTATIN 40 MG PO TABS: 40.0000 mg | ORAL_TABLET | Freq: Two times a day (BID) | ORAL | Status: DC

## 2011-02-13 ENCOUNTER — Telehealth: Payer: Self-pay | Admitting: *Deleted

## 2011-02-13 MED ORDER — SIMVASTATIN 40 MG PO TABS: ORAL_TABLET | ORAL | Status: DC

## 2011-02-13 NOTE — Telephone Encounter (Signed)
Form from [facility] pharmacy is on  Your desk, they are asking for clarification on zocor script.

## 2011-05-05 ENCOUNTER — Telehealth: Payer: Self-pay | Admitting: *Deleted

## 2011-05-05 NOTE — Telephone Encounter (Signed)
Noted. In the meantime, while not taking the Simva, ask him to start taking Co Q 10. He can get over the counter and will help muscular pain if from the Simva. Have him see me after off the med for a month, please.

## 2011-05-05 NOTE — Telephone Encounter (Signed)
Left message on answering machine to call back.

## 2011-05-05 NOTE — Telephone Encounter (Signed)
Patient called to let you know that he is going to stop taking his simvastatin for a couple of weeks because he has been having leg pain and also some weakness in his legs. He will call back after a couple of weeks to let us know if it improved with not taking the simvastatin.

## 2011-05-07 NOTE — Telephone Encounter (Signed)
Left message on voicemail to call back. Patient notified as instructed by telephone. Was informed by patient that he has an appointment scheduled in October and would rather wait and discuss this at that appointment.

## 2011-05-07 NOTE — Telephone Encounter (Signed)
Noted  

## 2011-06-24 ENCOUNTER — Other Ambulatory Visit: Payer: BLUE CROSS/BLUE SHIELD

## 2011-06-26 ENCOUNTER — Other Ambulatory Visit (INDEPENDENT_AMBULATORY_CARE_PROVIDER_SITE_OTHER): Payer: BLUE CROSS/BLUE SHIELD

## 2011-06-26 DIAGNOSIS — Z125 Encounter for screening for malignant neoplasm of prostate: Secondary | ICD-10-CM

## 2011-06-26 DIAGNOSIS — E7800 Pure hypercholesterolemia, unspecified: Secondary | ICD-10-CM

## 2011-06-26 DIAGNOSIS — E119 Type 2 diabetes mellitus without complications: Secondary | ICD-10-CM

## 2011-06-26 LAB — BASIC METABOLIC PANEL
CO2: 27 mEq/L (ref 19–32)
Calcium: 9.6 mg/dL (ref 8.4–10.5)
Glucose, Bld: 109 mg/dL — ABNORMAL HIGH (ref 70–99)
Sodium: 139 mEq/L (ref 135–145)

## 2011-06-26 LAB — HEPATIC FUNCTION PANEL
AST: 17 U/L (ref 0–37)
Albumin: 4.3 g/dL (ref 3.5–5.2)
Alkaline Phosphatase: 46 U/L (ref 39–117)
Total Protein: 6.3 g/dL (ref 6.0–8.3)

## 2011-06-26 LAB — CBC WITH DIFFERENTIAL/PLATELET
Eosinophils Relative: 3 % (ref 0.0–5.0)
HCT: 41.9 % (ref 39.0–52.0)
Hemoglobin: 14.1 g/dL (ref 13.0–17.0)
Lymphs Abs: 1.4 10*3/uL (ref 0.7–4.0)
Monocytes Relative: 10 % (ref 3.0–12.0)
Platelets: 245 10*3/uL (ref 150.0–400.0)
RBC: 4.43 Mil/uL (ref 4.22–5.81)
WBC: 5 10*3/uL (ref 4.5–10.5)

## 2011-06-26 LAB — MICROALBUMIN / CREATININE URINE RATIO
Creatinine,U: 38.3 mg/dL
Microalb Creat Ratio: 1.8 mg/g (ref 0.0–30.0)
Microalb, Ur: 0.7 mg/dL (ref 0.0–1.9)

## 2011-06-26 LAB — TSH: TSH: 1.17 u[IU]/mL (ref 0.35–5.50)

## 2011-06-26 LAB — LIPID PANEL: HDL: 41.9 mg/dL (ref >39.00)

## 2011-06-27 LAB — HEMOGLOBIN A1C: Hgb A1c MFr Bld: 7.2 % — ABNORMAL HIGH (ref 4.6–6.5)

## 2011-07-02 ENCOUNTER — Ambulatory Visit (INDEPENDENT_AMBULATORY_CARE_PROVIDER_SITE_OTHER): Payer: BLUE CROSS/BLUE SHIELD | Admitting: Family Medicine

## 2011-07-02 ENCOUNTER — Encounter: Payer: Self-pay | Admitting: Family Medicine

## 2011-07-02 DIAGNOSIS — E785 Hyperlipidemia, unspecified: Secondary | ICD-10-CM

## 2011-07-02 DIAGNOSIS — M1990 Unspecified osteoarthritis, unspecified site: Secondary | ICD-10-CM

## 2011-07-02 DIAGNOSIS — E119 Type 2 diabetes mellitus without complications: Secondary | ICD-10-CM

## 2011-07-02 DIAGNOSIS — K5730 Diverticulosis of large intestine without perforation or abscess without bleeding: Secondary | ICD-10-CM

## 2011-07-02 DIAGNOSIS — K5790 Diverticulosis of intestine, part unspecified, without perforation or abscess without bleeding: Secondary | ICD-10-CM

## 2011-07-02 DIAGNOSIS — F17200 Nicotine dependence, unspecified, uncomplicated: Secondary | ICD-10-CM

## 2011-07-02 MED ORDER — PRAVASTATIN SODIUM 40 MG PO TABS: 40.0000 mg | ORAL_TABLET | Freq: Every evening | ORAL | Status: DC

## 2011-07-02 NOTE — Progress Notes (Signed)
  Subjective:    Patient ID: Gary Everett, male    DOB: [birth year], 55 y.o.   MRN: [id number]  HPI Pt here for Comp Exam. His sugar is somedays high and somedays low. His sugar in AM is 128-139 which is high, his lows are after working all day, before eating at night 79-91. He can tell when low, he gets shaky. His A1C is 7.2, approx 155 or so. Have to question his machine.  He has no complaints and feels well.  When he works all day he gets tired. 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     Review of Systems  Constitutional: Negative for fever, chills, diaphoresis, appetite change, fatigue and unexpected weight change.  HENT: Positive for hearing loss (mild left sided loss.). Negative for ear pain, tinnitus and ear discharge.   Eyes: Negative for pain, discharge and visual disturbance.       Vision now better in that he sees better without his glasses.  Respiratory: Negative for cough, shortness of breath and wheezing.   Cardiovascular: Negative for chest pain and palpitations.       No SOB w/ exertion  Gastrointestinal: Negative for nausea, vomiting, abdominal pain, diarrhea, constipation and blood in stool.       No heartburn or swallowing problems.  Genitourinary: Negative for dysuria, frequency and difficulty urinating.       No nocturia  Musculoskeletal: Positive for back pain (possibly hip pain on the right.) and arthralgias (fingers in the knuckles. ). Negative for myalgias.  Skin: Negative for rash.       No itching or dryness. Has had three tick bites lately.  Neurological: Negative for tremors and numbness.       No tingling or balance problems.  Hematological: Negative for adenopathy. Does not bruise/bleed easily.  Psychiatric/Behavioral: Negative for dysphoric mood and agitation.       Objective:   Physical Exam  Constitutional: He is oriented to person, place, and time. He appears well-developed and  well-nourished. No distress.  HENT:  Head: Normocephalic and atraumatic.  Right Ear: External ear normal.  Left Ear: External ear normal.  Nose: Nose normal.  Mouth/Throat: Oropharynx is clear and moist.  Eyes: Conjunctivae and EOM are normal. Pupils are equal, round, and reactive to light. Right eye exhibits no discharge. Left eye exhibits no discharge. No scleral icterus.  Neck: Normal range of motion. Neck supple. No thyromegaly present.  Cardiovascular: Normal rate, regular rhythm, normal heart sounds and intact distal pulses.   No murmur heard. Pulmonary/Chest: Effort normal and breath sounds normal. No respiratory distress. He has no wheezes.  Abdominal: Soft. Bowel sounds are normal. He exhibits no distension and no mass. There is no tenderness. There is no rebound and no guarding.  Genitourinary: Rectum normal, prostate normal and penis normal. Guaiac negative stool.       Prostate 10gms, smooth symm, raphe intact.  Musculoskeletal: Normal range of motion. He exhibits no edema.  Lymphadenopathy:    He has no cervical adenopathy.  Neurological: He is alert and oriented to person, place, and time. Coordination normal.  Skin: Skin is warm and dry. No rash noted. He is not diaphoretic.  Psychiatric: He has a normal mood and affect. His behavior is normal. Judgment and thought content normal.          Assessment & Plan:

## 2011-07-02 NOTE — Assessment & Plan Note (Signed)
  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 Dicussed Tyl.

## 2011-07-02 NOTE — Assessment & Plan Note (Addendum)
Sugar control not quite where we want it but his nos are different. Will have him return in one month to check his machine vs ours.  Be careful with diet in the meantime. A1C 7.2. Monofilament nml.

## 2011-07-02 NOTE — Patient Instructions (Addendum)
RTC 1 month for Glu check with his machine and ours and then be seen by me. RTC 2 mos for chol recheck, lab prior.

## 2011-07-02 NOTE — Assessment & Plan Note (Signed)
Still smoking but slowly cutting down, Declines meds. Is down to 1/2 pack a day. Will continue to keep cutting down.

## 2011-07-02 NOTE — Assessment & Plan Note (Signed)
Discussed coming in for prolonged LLQ discomfort. 

## 2011-07-02 NOTE — Assessment & Plan Note (Signed)
LDL too high o/w good nos. 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 Will try Prava 40. Won't get as good of control but less chance of side effect. RTC 2 mos for recheck after starting. Given new script, 3 month version.

## 2011-08-07 ENCOUNTER — Ambulatory Visit (INDEPENDENT_AMBULATORY_CARE_PROVIDER_SITE_OTHER): Payer: BLUE CROSS/BLUE SHIELD | Admitting: Family Medicine

## 2011-08-07 ENCOUNTER — Encounter: Payer: Self-pay | Admitting: Family Medicine

## 2011-08-07 VITALS — BP 110/70 | HR 76 | Temp 97.7°F | Wt 174.2 lb

## 2011-08-07 DIAGNOSIS — E119 Type 2 diabetes mellitus without complications: Secondary | ICD-10-CM

## 2011-08-07 DIAGNOSIS — E785 Hyperlipidemia, unspecified: Secondary | ICD-10-CM

## 2011-08-07 MED ORDER — ACCU-CHEK SOFT TOUCH LANCETS MISC: Status: DC

## 2011-08-07 MED ORDER — GLUCOSE BLOOD VI STRP: ORAL_STRIP | Status: DC

## 2011-08-07 NOTE — Assessment & Plan Note (Signed)
Our no 93, his 115! Just the opposite of what I expected. Will cont to follow.

## 2011-08-07 NOTE — Progress Notes (Signed)
  Subjective:    Patient ID: Gary Stands., male    DOB: Mar 22, 1956, 55 y.o.   MRN: [id number]  HPI Pt here for 1 month follow up. He is here esssentially to check his machine vs our machine to see if the nos he is getting are accurate. He checks his sugar most days and throughout the day at various times and his machine's average three day, 7 day and one month are all around 115. Individual nos vary slightly but not much. A1C of 7.2 does not match those nos. He is tolerating the Prava fine w/o difficulty. We are scheduled to check that next month. He has gained three pounds since last visit, but is dressed today for winter. He has had some mild headaches lately but sound to be from pressure changes of the weather.    Review of SystemsNoncontributory except as above.       Objective:   Physical Exam  Constitutional: He appears well-developed and well-nourished. No distress.  HENT:  Head: Normocephalic and atraumatic.  Right Ear: External ear normal.  Left Ear: External ear normal.  Nose: Nose normal.  Mouth/Throat: Oropharynx is clear and moist.  Eyes: Conjunctivae and EOM are normal. Pupils are equal, round, and reactive to light. Right eye exhibits no discharge. Left eye exhibits no discharge.  Neck: Normal range of motion. Neck supple.  Cardiovascular: Normal rate and regular rhythm.   Pulmonary/Chest: Effort normal and breath sounds normal. He has no wheezes.  Lymphadenopathy:    He has no cervical adenopathy.  Skin: He is not diaphoretic.          Assessment & Plan:

## 2011-08-07 NOTE — Patient Instructions (Signed)
Keep appt next month.

## 2011-09-01 ENCOUNTER — Other Ambulatory Visit (INDEPENDENT_AMBULATORY_CARE_PROVIDER_SITE_OTHER): Payer: BLUE CROSS/BLUE SHIELD

## 2011-09-01 DIAGNOSIS — E785 Hyperlipidemia, unspecified: Secondary | ICD-10-CM

## 2011-09-01 DIAGNOSIS — E119 Type 2 diabetes mellitus without complications: Secondary | ICD-10-CM

## 2011-09-01 LAB — LIPID PANEL
HDL: 38.8 mg/dL — ABNORMAL LOW (ref >39.00)
Total CHOL/HDL Ratio: 3
Triglycerides: 55 mg/dL (ref 0.0–149.0)

## 2011-09-01 LAB — HEMOGLOBIN A1C: Hgb A1c MFr Bld: 6.3 % (ref 4.6–6.5)

## 2011-09-01 LAB — AST: AST: 16 U/L (ref 0–37)

## 2011-09-03 ENCOUNTER — Ambulatory Visit (INDEPENDENT_AMBULATORY_CARE_PROVIDER_SITE_OTHER): Payer: BLUE CROSS/BLUE SHIELD | Admitting: Family Medicine

## 2011-09-03 ENCOUNTER — Encounter: Payer: Self-pay | Admitting: Family Medicine

## 2011-09-03 DIAGNOSIS — E119 Type 2 diabetes mellitus without complications: Secondary | ICD-10-CM

## 2011-09-03 DIAGNOSIS — E785 Hyperlipidemia, unspecified: Secondary | ICD-10-CM

## 2011-09-03 NOTE — Assessment & Plan Note (Signed)
Good nos but is having lower leg muscle spasms. He will probably taker it off and on. We discussed antiinflammation approach for CAD and suggested B vitamins, Grape seed extract, Vit C and Astaxanthin.

## 2011-09-03 NOTE — Progress Notes (Signed)
  Subjective:    Patient ID: Gary Stands., male    DOB: 10/18/1955, 55 y.o.   MRN: [id number]  HPI Pt here for chol recheck. HE had been on Simva with great control of nos but bothered him and he stopped it. 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     Review of Systems  Constitutional: Negative for fever, chills, diaphoresis, activity change, appetite change and fatigue.  HENT: Negative for hearing loss, ear pain, congestion, sore throat, rhinorrhea, neck pain, neck stiffness, postnasal drip, sinus pressure, tinnitus and ear discharge.   Eyes: Negative for pain, discharge and visual disturbance.  Respiratory: Negative for cough, shortness of breath and wheezing.   Cardiovascular: Negative for chest pain and palpitations.       No SOB w/ exertion  Gastrointestinal:       No heartburn or swallowing problems.  Genitourinary:       No nocturia  Skin:       No itching or dryness.  Neurological:       No tingling or balance problems.  All other systems reviewed and are negative.       Objective:   Physical Exam  Constitutional: He appears well-developed and well-nourished. No distress.  HENT:  Head: Normocephalic and atraumatic.  Right Ear: External ear normal.  Left Ear: External ear normal.  Nose: Nose normal.  Mouth/Throat: Oropharynx is clear and moist.  Eyes: Conjunctivae and EOM are normal. Pupils are equal, round, and reactive to light. Right eye exhibits no discharge. Left eye exhibits no discharge.  Neck: Normal range of motion. Neck supple.  Cardiovascular: Normal rate and regular rhythm.   Pulmonary/Chest: Effort normal and breath sounds normal. He has no wheezes.  Lymphadenopathy:    He has no cervical adenopathy.  Skin: He is not diaphoretic.          Assessment & Plan:

## 2011-09-03 NOTE — Patient Instructions (Signed)
RTC 3 month for recheck and get acquainted with Dr Para March. A1C  Prior.

## 2011-09-03 NOTE — Assessment & Plan Note (Signed)
Has been trying harder. Will have rechecked when sees Dr Para March. Lab Results  Component Value Date   HGBA1C 6.3 09/01/2011

## 2011-12-04 ENCOUNTER — Encounter: Payer: Self-pay | Admitting: Family Medicine

## 2011-12-04 ENCOUNTER — Ambulatory Visit (INDEPENDENT_AMBULATORY_CARE_PROVIDER_SITE_OTHER): Payer: BLUE CROSS/BLUE SHIELD | Admitting: Family Medicine

## 2011-12-04 VITALS — BP 122/62 | HR 66 | Temp 97.6°F | Wt 179.2 lb

## 2011-12-04 DIAGNOSIS — E119 Type 2 diabetes mellitus without complications: Secondary | ICD-10-CM

## 2011-12-04 DIAGNOSIS — F17200 Nicotine dependence, unspecified, uncomplicated: Secondary | ICD-10-CM

## 2011-12-04 DIAGNOSIS — Z8042 Family history of malignant neoplasm of prostate: Secondary | ICD-10-CM | POA: Insufficient documentation

## 2011-12-04 DIAGNOSIS — E785 Hyperlipidemia, unspecified: Secondary | ICD-10-CM

## 2011-12-04 DIAGNOSIS — M5137 Other intervertebral disc degeneration, lumbosacral region: Secondary | ICD-10-CM

## 2011-12-04 LAB — LIPID PANEL
Cholesterol: 157 mg/dL (ref 0–200)
HDL: 40.4 mg/dL (ref >39.00)
LDL Cholesterol: 104 mg/dL — ABNORMAL HIGH (ref 0–99)
VLDL: 13 mg/dL (ref 0.0–40.0)

## 2011-12-04 NOTE — Progress Notes (Signed)
Diabetes:  Using medications without difficulties:yes Hypoglycemic episodes:no Hyperglycemic episodes:no Feet problems: no Blood Sugars averaging: 100-130 Am, fasting eye exam within last year: no, has f/u pending  Elevated Cholesterol: Using medications without problems: statin intolerant Muscle aches: not when med was stopped Diet compliance:yes Exercise:yes  R lower back pain.  Pain with back extension.  Less pain with forward flexion.  No weakness in legs.  Worse with prolonged sitting.   Try to cut down on smoking, 1/2 PPD.  We discussed.  He's thinking about it.    PMH and SH reviewed  Meds, vitals, and allergies reviewed.   ROS: See HPI.  Otherwise negative.    GEN: nad, alert and oriented HEENT: mucous membranes moist NECK: supple w/o LA CV: rrr. PULM: ctab, no inc wob ABD: soft, +bs EXT: no edema SKIN: no acute rash R lower back with paraspinal tenderness and pain with facet loading.    Diabetic foot exam: Normal inspection No skin breakdown Calluses noted Normal DP pulses Normal sensation to light touch and monofilament Nails normal

## 2011-12-04 NOTE — Patient Instructions (Signed)
Keep stretching your back and we'll call you about your labs.  Take care.  Recheck sugar in 6 months before another visit.  Don't change your meds for now.

## 2011-12-05 ENCOUNTER — Encounter: Payer: Self-pay | Admitting: *Deleted

## 2011-12-08 ENCOUNTER — Encounter: Payer: Self-pay | Admitting: Family Medicine

## 2011-12-08 NOTE — Assessment & Plan Note (Signed)
Lipids okay, statin intolerant.

## 2011-12-08 NOTE — Assessment & Plan Note (Signed)
Discussed, he's working on quitting.

## 2011-12-08 NOTE — Assessment & Plan Note (Signed)
With no red flags on exam, continue stretching and f/u prn.

## 2011-12-08 NOTE — Assessment & Plan Note (Signed)
A1c controlled, continue work on diet and exercise.

## 2011-12-31 ENCOUNTER — Telehealth: Payer: Self-pay

## 2011-12-31 NOTE — Telephone Encounter (Signed)
Bryan with [employer] left v/m that Shanda Bumps with [employer] called to see if pt Para March received medical device. Shanda Bumps can be reached at [phone].

## 2012-01-01 NOTE — Telephone Encounter (Signed)
I don't know how this message became garbled but ...Marland KitchenMarland KitchenThe real question here is if we have received the form that needs to be filled out for life insurance?  I haven't seen it.

## 2012-01-02 NOTE — Telephone Encounter (Signed)
I have not gotten a form about that.

## 2012-03-18 ENCOUNTER — Other Ambulatory Visit: Payer: Self-pay

## 2012-03-18 MED ORDER — ONETOUCH LANCETS MISC: Status: DC

## 2012-03-18 MED ORDER — METFORMIN HCL 500 MG PO TABS: 500.0000 mg | ORAL_TABLET | Freq: Two times a day (BID) | ORAL | Status: DC

## 2012-03-18 MED ORDER — GLUCOSE BLOOD VI STRP: ORAL_STRIP | Status: DC

## 2012-03-18 NOTE — Telephone Encounter (Signed)
Please send when signed.  Thanks.

## 2012-03-18 NOTE — Telephone Encounter (Signed)
Pt brought by form( at [employer]) for [employer] written rx for Metformin,one touch lancets and test strips. Pt request call back when completed and mailed.

## 2012-03-19 NOTE — Telephone Encounter (Signed)
Patient notified.  Rx and form mailed in envelope provided by patient that is self-addressed.

## 2012-05-17 ENCOUNTER — Other Ambulatory Visit: Payer: Self-pay | Admitting: Family Medicine

## 2012-05-17 DIAGNOSIS — Z125 Encounter for screening for malignant neoplasm of prostate: Secondary | ICD-10-CM

## 2012-05-17 DIAGNOSIS — E119 Type 2 diabetes mellitus without complications: Secondary | ICD-10-CM

## 2012-05-25 ENCOUNTER — Other Ambulatory Visit (INDEPENDENT_AMBULATORY_CARE_PROVIDER_SITE_OTHER): Payer: BLUE CROSS/BLUE SHIELD

## 2012-05-25 DIAGNOSIS — Z125 Encounter for screening for malignant neoplasm of prostate: Secondary | ICD-10-CM

## 2012-05-25 DIAGNOSIS — E119 Type 2 diabetes mellitus without complications: Secondary | ICD-10-CM

## 2012-05-25 LAB — LIPID PANEL
Cholesterol: 171 mg/dL (ref 0–200)
LDL Cholesterol: 122 mg/dL — ABNORMAL HIGH (ref 0–99)
Triglycerides: 65 mg/dL (ref 0.0–149.0)
VLDL: 13 mg/dL (ref 0.0–40.0)

## 2012-05-25 LAB — COMPREHENSIVE METABOLIC PANEL
AST: 19 U/L (ref 0–37)
Alkaline Phosphatase: 43 U/L (ref 39–117)
BUN: 16 mg/dL (ref 6–23)
Creatinine, Ser: 1 mg/dL (ref 0.4–1.5)
Potassium: 5 mEq/L (ref 3.5–5.1)

## 2012-05-25 LAB — HEMOGLOBIN A1C: Hgb A1c MFr Bld: 6.1 % (ref 4.6–6.5)

## 2012-05-26 ENCOUNTER — Telehealth: Payer: Self-pay | Admitting: *Deleted

## 2012-05-26 NOTE — Telephone Encounter (Signed)
Please give a copy to the patient.  Thanks.

## 2012-05-26 NOTE — Telephone Encounter (Signed)
Patient called requesting a copy of his lab results that were drawn this week. 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

## 2012-05-26 NOTE — Telephone Encounter (Signed)
Patient notified that copy is up front ready for him to pick up.

## 2012-06-01 ENCOUNTER — Encounter: Payer: Self-pay | Admitting: Family Medicine

## 2012-06-01 ENCOUNTER — Ambulatory Visit (INDEPENDENT_AMBULATORY_CARE_PROVIDER_SITE_OTHER): Payer: BLUE CROSS/BLUE SHIELD | Admitting: Family Medicine

## 2012-06-01 VITALS — BP 108/70 | HR 76 | Temp 98.1°F | Wt 174.0 lb

## 2012-06-01 DIAGNOSIS — E785 Hyperlipidemia, unspecified: Secondary | ICD-10-CM

## 2012-06-01 DIAGNOSIS — E119 Type 2 diabetes mellitus without complications: Secondary | ICD-10-CM

## 2012-06-01 DIAGNOSIS — F17200 Nicotine dependence, unspecified, uncomplicated: Secondary | ICD-10-CM

## 2012-06-01 DIAGNOSIS — Z8042 Family history of malignant neoplasm of prostate: Secondary | ICD-10-CM

## 2012-06-01 NOTE — Progress Notes (Signed)
Diabetes:  Using medications without difficulties:yes Hypoglycemic episodes: rare- if prolonged fasting Hyperglycemic episodes:no Feet problems:no Blood Sugars averaging: in AM ~120 eye exam within last year: due and he'll f/u about this. A1c 6.1  FH prostate cancer.  PSA wnl.  Uncle with history.  Labs discussed.    Elevated Cholesterol: Using medications without problems: no meds due to myalgias Diet compliance: yes, but "occasionally I slip off the wagon" Exercise: "not enough",  discussed.   Smoking discussed.  He's still considering quitting.    New granddaughter (Ava) born about 4 months ago.  I congratulated him.    PMH and SH reviewed  Meds, vitals, and allergies reviewed.   ROS: See HPI.  Otherwise negative.    GEN: nad, alert and oriented HEENT: mucous membranes moist NECK: supple w/o LA CV: rrr. PULM: ctab, no inc wob ABD: soft, +bs EXT: no edema SKIN: no acute rash  Diabetic foot exam: Normal inspection No skin breakdown Calluses noted on the balls of the feet Normal DP pulses Normal sensation to light touch and monofilament Nails normal

## 2012-06-01 NOTE — Patient Instructions (Addendum)
I would get a flu shot each fall.   Recheck labs in 6 months before a visit.  Take care.  Keep working on [employer] and exercise.  Glad to see you.

## 2012-06-02 NOTE — Assessment & Plan Note (Signed)
D/w pt about cessation.  

## 2012-06-02 NOTE — Assessment & Plan Note (Signed)
Statin intolerant.  Continue to work on diet and exercise.   Recheck again in 2014.

## 2012-06-02 NOTE — Assessment & Plan Note (Signed)
Continue current meds.  Continue to work on diet and exercise.   Recheck again in 2014.

## 2012-06-02 NOTE — Assessment & Plan Note (Signed)
PSA wnl.  D/w pt.   

## 2012-08-23 ENCOUNTER — Emergency Department: Payer: Self-pay

## 2012-08-23 LAB — CK TOTAL AND CKMB (NOT AT ARMC)
CK, Total: 73 U/L (ref 35–232)
CK-MB: 0.9 ng/mL (ref 0.5–3.6)

## 2012-08-23 LAB — BASIC METABOLIC PANEL
Calcium, Total: 8.9 mg/dL (ref 8.5–10.1)
Creatinine: 0.92 mg/dL (ref 0.60–1.30)
EGFR (African American): >60
Glucose: 102 mg/dL — ABNORMAL HIGH (ref 65–99)
Sodium: 137 mmol/L (ref 136–145)

## 2012-08-23 LAB — CBC
HGB: 14.4 g/dL (ref 13.0–18.0)
MCH: 31.8 pg (ref 26.0–34.0)
MCHC: 35 g/dL (ref 32.0–36.0)
RDW: 13.1 % (ref 11.5–14.5)

## 2012-08-23 LAB — HEPATIC FUNCTION PANEL A (ARMC)
Albumin: 4.1 g/dL (ref 3.4–5.0)
SGOT(AST): 23 U/L (ref 15–37)

## 2012-09-14 ENCOUNTER — Ambulatory Visit (INDEPENDENT_AMBULATORY_CARE_PROVIDER_SITE_OTHER): Payer: BLUE CROSS/BLUE SHIELD | Admitting: Family Medicine

## 2012-09-14 ENCOUNTER — Encounter: Payer: Self-pay | Admitting: Family Medicine

## 2012-09-14 VITALS — BP 122/82 | HR 72 | Temp 97.6°F | Ht 67.0 in | Wt 178.0 lb

## 2012-09-14 DIAGNOSIS — Z0000 Encounter for general adult medical examination without abnormal findings: Secondary | ICD-10-CM

## 2012-09-14 DIAGNOSIS — Z029 Encounter for administrative examinations, unspecified: Secondary | ICD-10-CM

## 2012-09-14 LAB — POCT URINALYSIS DIPSTICK
Bilirubin, UA: NEGATIVE
Glucose, UA: NEGATIVE
Leukocytes, UA: NEGATIVE
Nitrite, UA: NEGATIVE

## 2012-09-14 NOTE — Patient Instructions (Addendum)
Take care.  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  Fill out the top part of the first page and drop if off for me to complete.   Glad to see you.

## 2012-09-14 NOTE — Progress Notes (Signed)
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   He has color vision, normal hearing on the whisper test >5 ft.   Binocular vision.  Wears glasses driving.  Passed vision test.  Urine was normal on testing today.  Feeling well.   DM2 controlled, no trouble with low sugars.  Compliant and controlled by A1c.  No DM2 complications.    PMH and SH reviewed  Meds, vitals, and allergies reviewed.   ROS: See HPI.  Otherwise negative.    GEN: nad, alert and oriented HEENT: mucous membranes moist, PERRL, EOMI NECK: supple w/o LA CV: rrr. PULM: ctab, no inc wob ABD: soft, +bs EXT: no edema SKIN: no acute rash CN 2-12 wnl B, S/S/DTR wnl x4 No hernia B Normal rom of the back and ext x4

## 2012-09-14 NOTE — Assessment & Plan Note (Signed)
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   He has color vision, normal hearing on the whisper test >5 ft.   Binocular vision.  Wears glasses driving.  Passed vision test.  Urine was normal on testing today.  Feeling well. Normal exam.   DM2 controlled, no trouble with low sugars.  Compliant and controlled by A1c.  No DM2 complications.   BP normal. He'll qualify for 1 year certification from today.

## 2012-09-22 DIAGNOSIS — K529 Noninfective gastroenteritis and colitis, unspecified: Secondary | ICD-10-CM

## 2012-09-22 HISTORY — DX: Noninfective gastroenteritis and colitis, unspecified: K52.9

## 2012-11-30 ENCOUNTER — Other Ambulatory Visit (INDEPENDENT_AMBULATORY_CARE_PROVIDER_SITE_OTHER): Payer: BC Managed Care – PPO

## 2012-11-30 DIAGNOSIS — E119 Type 2 diabetes mellitus without complications: Secondary | ICD-10-CM

## 2012-12-07 ENCOUNTER — Ambulatory Visit (INDEPENDENT_AMBULATORY_CARE_PROVIDER_SITE_OTHER): Payer: BC Managed Care – PPO | Admitting: Family Medicine

## 2012-12-07 ENCOUNTER — Encounter: Payer: Self-pay | Admitting: Family Medicine

## 2012-12-07 VITALS — BP 108/68 | HR 69 | Temp 98.4°F

## 2012-12-07 DIAGNOSIS — E119 Type 2 diabetes mellitus without complications: Secondary | ICD-10-CM

## 2012-12-07 DIAGNOSIS — K4020 Bilateral inguinal hernia, without obstruction or gangrene, not specified as recurrent: Secondary | ICD-10-CM | POA: Insufficient documentation

## 2012-12-07 DIAGNOSIS — K4090 Unilateral inguinal hernia, without obstruction or gangrene, not specified as recurrent: Secondary | ICD-10-CM

## 2012-12-07 DIAGNOSIS — M720 Palmar fascial fibromatosis [Dupuytren]: Secondary | ICD-10-CM | POA: Insufficient documentation

## 2012-12-07 NOTE — Assessment & Plan Note (Signed)
Controlled, continue as is.  Recheck in ~6 months.  A1c d/w pt.

## 2012-12-07 NOTE — Assessment & Plan Note (Signed)
Soft, refer to gen surgery.  Not incarcerated, precautions given to patient.

## 2012-12-07 NOTE — Progress Notes (Signed)
Diabetes:  Using medications without difficulties:yes Hypoglycemic episodes:no Hyperglycemic episodes:no Feet problems:no Blood Sugars averaging: usually 100-130s  He had trouble with the phone system and I'll talk with our office manager about this.   He's had some L inguinal pain over the last few weeks, with lifting or putting his boots on.    Meds, vitals, and allergies reviewed.   ROS: See HPI.  Otherwise negative.    GEN: nad, alert and oriented HEENT: mucous membranes moist NECK: supple w/o LA CV: rrr. 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 He'll let me know if it is worsening (he does have full extension now) and we can refer to the hand clinic.   L inguinal area with soft hernia noted with a cough, doesn't feel incarcerated and isnt' ttp  Diabetic foot exam: Normal inspection No skin breakdown Calluses noted on great toes Normal DP pulses Normal sensation to light touch and monofilament Nails normal

## 2012-12-07 NOTE — Patient Instructions (Addendum)
See Shirlee Limerick about your referral before you leave today. Recheck labs in 6 months before a physical.  Take care. Keep working on [employer].  Glad to see you.

## 2012-12-07 NOTE — Assessment & Plan Note (Signed)
Can refer to hand clinic if worsening.  He agrees to notify us prn.

## 2012-12-13 ENCOUNTER — Encounter (INDEPENDENT_AMBULATORY_CARE_PROVIDER_SITE_OTHER): Payer: Self-pay | Admitting: Surgery

## 2012-12-13 ENCOUNTER — Ambulatory Visit (INDEPENDENT_AMBULATORY_CARE_PROVIDER_SITE_OTHER): Payer: BC Managed Care – PPO | Admitting: Surgery

## 2012-12-13 VITALS — BP 110/62 | HR 80 | Resp 18 | Ht 67.0 in | Wt 174.0 lb

## 2012-12-13 DIAGNOSIS — K4020 Bilateral inguinal hernia, without obstruction or gangrene, not specified as recurrent: Secondary | ICD-10-CM

## 2012-12-13 NOTE — Patient Instructions (Addendum)
Hernia A hernia occurs when an internal organ pushes out through a weak spot in the abdominal wall. Hernias most commonly occur in the groin and around the navel. Hernias often can be pushed back into place (reduced). Most hernias tend to get worse over time. Some abdominal hernias can get stuck in the opening (irreducible or incarcerated hernia) and cannot be reduced. An irreducible abdominal hernia which is tightly squeezed into the opening is at risk for impaired blood supply (strangulated hernia). A strangulated hernia is a medical emergency. Because of the risk for an irreducible or strangulated hernia, surgery may be recommended to repair a hernia. CAUSES   Heavy lifting.  Prolonged coughing.  Straining to have a bowel movement.  A cut (incision) made during an abdominal surgery. HOME CARE INSTRUCTIONS   Bed rest is not required. You may continue your normal activities.  Avoid lifting more than 10 pounds (4.5 kg) or straining.  Cough gently. If you are a smoker it is best to stop. Even the best hernia repair can break down with the continual strain of coughing. Even if you do not have your hernia repaired, a cough will continue to aggravate the problem.  Do not wear anything tight over your hernia. Do not try to keep it in with an outside bandage or truss. These can damage abdominal contents if they are trapped within the hernia sac.  Eat a normal diet.  Avoid constipation. Straining over long periods of time will increase hernia size and encourage breakdown of repairs. If you cannot do this with diet alone, stool softeners may be used. SEEK IMMEDIATE MEDICAL CARE IF:   You have a fever.  You develop increasing abdominal pain.  You feel nauseous or vomit.  Your hernia is stuck outside the abdomen, looks discolored, feels hard, or is tender.  You have any changes in your bowel habits or in the hernia that are unusual for you.  You have increased pain or swelling around the  hernia.  You cannot push the hernia back in place by applying gentle pressure while lying down. MAKE SURE YOU:   Understand these instructions.  Will watch your condition.  Will get help right away if you are not doing well or get worse. Document Released: 09/08/2005 Document Revised: 12/01/2011 Document Reviewed: 04/27/2008 [facility] Patient Information 2013 [place], [place].   HERNIA REPAIR: POST OP INSTRUCTIONS  1. DIET: Follow a light bland diet the first 24 hours after arrival home, such as soup, liquids, crackers, etc.  Be sure to include lots of fluids daily.  Avoid fast food or heavy meals as your are more likely to get nauseated.  Eat a low fat the next few days after surgery. 2. Take your usually prescribed home medications unless otherwise directed. 3. PAIN CONTROL: a. Pain is best controlled by a usual combination of three different methods TOGETHER: i. Ice/Heat ii. Over the counter pain medication iii. Prescription pain medication b. Most patients will experience some swelling and bruising around the hernia(s) such as the bellybutton, groins, or old incisions.  Ice packs or heating pads (30-60 minutes up to 6 times a day) will help. Use ice for the first few days to help decrease swelling and bruising, then switch to heat to help relax tight/sore spots and speed recovery.  Some people prefer to use ice alone, heat alone, alternating between ice & heat.  Experiment to what works for you.  Swelling and bruising can take several weeks to resolve.   c. It is  helpful to take an over-the-counter pain medication regularly for the first few weeks.  Choose one of the following that works best for you: i. Naproxen (Aleve, etc)  Two 268m tabs twice a day ii. Ibuprofen (Advil, etc) Three 2023mtabs four times a day (every meal & bedtime) iii. Acetaminophen (Tylenol, etc) 325-65037mour times a day (every meal & bedtime) d. A  prescription for pain medication should be given to you upon  discharge.  Take your pain medication as prescribed.  i. If you are having problems/concerns with the prescription medicine (does not control pain, nausea, vomiting, rash, itching, etc), please call us [phone] see if we need to switch you to a different pain medicine that will work better for you and/or control your side effect better. ii. If you need a refill on your pain medication, please contact your pharmacy.  They will contact our office to request authorization. Prescriptions will not be filled after 5 pm or on week-ends. 4. Avoid getting constipated.  Between the surgery and the pain medications, it is common to experience some constipation.  Increasing fluid intake and taking a fiber supplement (such as Metamucil, Citrucel, FiberCon, MiraLax, etc) 1-2 times a day regularly will usually help prevent this problem from occurring.  A mild laxative (prune juice, Milk of Magnesia, MiraLax, etc) should be taken according to package directions if there are no bowel movements after 48 hours.   5. Wash / shower every day.  You may shower over the dressings as they are waterproof.   6. Remove your waterproof bandages 5 days after surgery.  You may leave the incision open to air.  You may replace a dressing/Band-Aid to cover the incision for comfort if you wish.  Continue to shower over incision(s) after the dressing is off.    7. ACTIVITIES as tolerated:   a. You may resume regular (light) daily activities beginning the next day-such as daily self-care, walking, climbing stairs-gradually increasing activities as tolerated.  If you can walk 30 minutes without difficulty, it is safe to try more intense activity such as jogging, treadmill, bicycling, low-impact aerobics, swimming, etc. b. Save the most intensive and strenuous activity for last such as sit-ups, heavy lifting, contact sports, etc  Refrain from any heavy lifting or straining until you are off narcotics for pain control.   c. DO NOT  PUSH THROUGH PAIN.  Let pain be your guide: If it hurts to do something, don't do it.  Pain is your body warning you to avoid that activity for another week until the pain goes down. d. You may drive when you are no longer taking prescription pain medication, you can comfortably wear a seatbelt, and you can safely maneuver your car and apply brakes. e. YouDennis Basty have sexual intercourse when it is comfortable.  8. FOLLOW UP in our office a. Please call [facility] at [phone] to set up an appointment to see your surgeon in the office for a follow-up appointment approximately 2-3 weeks after your surgery. b. Make sure that you call for this appointment the day you arrive home to insure a convenient appointment time. 9.  IF YOU HAVE DISABILITY OR FAMILY LEAVE FORMS, BRING THEM TO THE OFFICE FOR PROCESSING.  DO NOT GIVE THEM TO YOUR DOCTOR.  WHEN TO CALL US [phone]. Poor pain control 2. Reactions / problems with new medications (rash/itching, nausea, etc)  3. Fever over 101.5 F (38.5 C) 4. Inability to urinate 5. Nausea and/or vomiting  6. Worsening swelling or bruising 7. Continued bleeding from incision. 8. Increased pain, redness, or drainage from the incision   The clinic staff is available to answer your questions during regular business hours (8:30am-5pm).  Please don't hesitate to call and ask to speak to one of our nurses for clinical concerns.   If you have a medical emergency, go to the nearest emergency room or call 911.  A surgeon from [facility] is always on call at the hospitals in [facility], [place] [address], [address], [place], [place]  [address] ?  [address] [address], [place], [place]   [address] MAIN: [phone] ? TOLL FREE: [phone] ? FAX: [phone] [email or web address]  Smoking Cessation Quitting smoking is important to your health and has many advantages. However, it is not always easy to quit since  nicotine is a very addictive drug. Often times, people try 3 times or more before being able to quit. This document explains the best ways for you to prepare to quit smoking. Quitting takes hard work and a lot of effort, but you can do it. ADVANTAGES OF QUITTING SMOKING  You will live longer, feel better, and live better.  Your body will feel the impact of quitting smoking almost immediately.  Within 20 minutes, blood pressure decreases. Your pulse returns to its normal level.  After 8 hours, carbon monoxide levels in the blood return to normal. Your oxygen level increases.  After 24 hours, the chance of having a heart attack starts to decrease. Your breath, hair, and body stop smelling like smoke.  After 48 hours, damaged nerve endings begin to recover. Your sense of taste and smell improve.  After 72 hours, the body is virtually free of nicotine. Your bronchial tubes relax and breathing becomes easier.  After 2 to 12 weeks, lungs can hold more air. Exercise becomes easier and circulation improves.  The risk of having a heart attack, stroke, cancer, or lung disease is greatly reduced.  After 1 year, the risk of coronary heart disease is cut in half.  After 5 years, the risk of stroke falls to the same as a nonsmoker.  After 10 years, the risk of lung cancer is cut in half and the risk of other cancers decreases significantly.  After 15 years, the risk of coronary heart disease drops, usually to the level of a nonsmoker.  If you are pregnant, quitting smoking will improve your chances of having a healthy baby.  The people you live with, especially any children, will be healthier.  You will have extra money to spend on things other than cigarettes. QUESTIONS TO THINK ABOUT BEFORE ATTEMPTING TO QUIT You may want to talk about your answers with your caregiver.  Why do you want to quit?  If you tried to quit in the past, what helped and what did not?  What will be the most  difficult situations for you after you quit? How will you plan to handle them?  Who can help you through the tough times? Your family? Friends? A caregiver?  What pleasures do you get from smoking? What ways can you still get pleasure if you quit? Here are some questions to ask your caregiver:  How can you help me to be successful at quitting?  What medicine do you think would be best for me and how should I take it?  What should I do if I need more help?  What is smoking withdrawal like? How can I  get information on withdrawal? GET READY  Set a quit date.  Change your environment by getting rid of all cigarettes, ashtrays, matches, and lighters in your home, car, or work. Do not let people smoke in your home.  Review your past attempts to quit. Think about what worked and what did not. GET SUPPORT AND ENCOURAGEMENT You have a better chance of being successful if you have help. You can get support in many ways.  Tell your family, friends, and co-workers that you are going to quit and need their support. Ask them not to smoke around you.  Get individual, group, or telephone counseling and support. Programs are available at [employer] and health centers. Call your local health department for information about programs in your area.  Spiritual beliefs and practices may help some smokers quit.  Download a "quit meter" on your computer to keep track of quit statistics, such as how long you have gone without smoking, cigarettes not smoked, and money saved.  Get a self-help book about quitting smoking and staying off of tobacco. LEARN NEW SKILLS AND BEHAVIORS  Distract yourself from urges to smoke. Talk to someone, go for a walk, or occupy your time with a task.  Change your normal routine. Take a different route to work. Drink tea instead of coffee. Eat breakfast in a different place.  Reduce your stress. Take a hot bath, exercise, or read a book.  Plan something enjoyable to  do every day. Reward yourself for not smoking.  Explore interactive web-based programs that specialize in helping you quit. GET MEDICINE AND USE IT CORRECTLY Medicines can help you stop smoking and decrease the urge to smoke. Combining medicine with the above behavioral methods and support can greatly increase your chances of successfully quitting smoking.  Nicotine replacement therapy helps deliver nicotine to your body without the negative effects and risks of smoking. Nicotine replacement therapy includes nicotine gum, lozenges, inhalers, nasal sprays, and skin patches. Some may be available over-the-counter and others require a prescription.  Antidepressant medicine helps people abstain from smoking, but how this works is unknown. This medicine is available by prescription.  Nicotinic receptor partial agonist medicine simulates the effect of nicotine in your brain. This medicine is available by prescription. Ask your caregiver for advice about which medicines to use and how to use them based on your health history. Your caregiver will tell you what side effects to look out for if you choose to be on a medicine or therapy. Carefully read the information on the package. Do not use any other product containing nicotine while using a nicotine replacement product.  RELAPSE OR DIFFICULT SITUATIONS Most relapses occur within the first 3 months after quitting. Do not be discouraged if you start smoking again. Remember, most people try several times before finally quitting. You may have symptoms of withdrawal because your body is used to nicotine. You may crave cigarettes, be irritable, feel very hungry, cough often, get headaches, or have difficulty concentrating. The withdrawal symptoms are only temporary. They are strongest when you first quit, but they will go away within 10 14 days. To reduce the chances of relapse, try to:  Avoid drinking alcohol. Drinking lowers your chances of successfully  quitting.  Reduce the amount of caffeine you consume. Once you quit smoking, the amount of caffeine in your body increases and can give you symptoms, such as a rapid heartbeat, sweating, and anxiety.  Avoid smokers because they can make you want to smoke.  Do  not let weight gain distract you. Many smokers will gain weight when they quit, usually less than 10 pounds. Eat a healthy diet and stay active. You can always lose the weight gained after you quit.  Find ways to improve your mood other than smoking. FOR MORE INFORMATION  www.smokefree.gov  Document Released: 09/02/2001 Document Revised: 03/09/2012 Document Reviewed: 12/18/2011 [facility] Patient Information 2013 [place], [place].

## 2012-12-13 NOTE — Progress Notes (Signed)
Subjective:     Patient ID: Gary Everett., male   DOB: [birth year], 57 y.o.   MRN: [id number]  HPI  Gary Everett  [birth year] [id number]  Patient Care Team: Joaquim Nam, MD as PCP - General (Family Medicine) Ardeth Sportsman, MD as Consulting Physician (General Surgery)  This patient is a 57 y.o.male who presents today for surgical evaluation at the request of Dr. Para March.   Reason for visit: Left inguinal hernia  Pleasant active male.  Works for [employer].  Felt some left groin pain and discomfort two weeks ago.  Happen when he was pulling up his boots.  Worse with activity.So felt a nominal bulge.  Saw his primary care physician.  Concern for hernia.  Surgical consultation requested.Did not seem to have gone away.  He can walk 10 miles without difficulty.  Does light to moderate activity work.  No severe heavy lifting.  Smokes a half pack of cigarettes a day.  Usually has a daily bowel movement.  No prior abdominal or inguinal hernia surgeries.  Patient Active Problem List  Diagnosis  . DIABETES MELLITUS, TYPE II  . HYPERLIPIDEMIA  . NICOTINE ADDICTION  . HEMORRHOIDS, WITH BLEEDING  . OSTEOARTHROSIS NOS, UNSPECIFIED SITE  . DEGEN LUMBAR/LUMBOSACRAL INTERVERTEBRAL DISC  . Diverticulosis  . FH: prostate cancer  . Administrative encounter  . Inguinal hernia  . Dupuytren contracture    Past Medical History  Diagnosis Date  . Diabetes mellitus, type 2 08/22/04  . 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 OA (osteoarthritis)     of hands  . Hyperlipemia 05/23/04    statin intolerant  . Kidney stones     Past Surgical History  Procedure Laterality Date  . Cervical fusion  1998    4/5/6 Dr. Channing Mutters  . Septoplasty  1999    [facility]  . Foot surgery  2009  . Kidney stone surgery  1979-present    History   Social History  . Marital Status: Married    Spouse Name: N/A    Number of Children: 1    . Years of Education: N/A   Occupational History  . POLICE OFFICER     [place]   Social History Main Topics  . Smoking status: Current Every Day Smoker -- 0.50 packs/day for 40 years    Types: Cigarettes  . Smokeless tobacco: Never Used  . Alcohol Use: No     Comment: essentially none, very rarely  . Drug Use: No  . Sexually Active: No   Other Topics Concern  . Not on file   Social History Narrative   Retired from [employer], still contracting as of 2013   Remarried 2003   1 child from prev relationship    Family History  Problem Relation Age of Onset  . Diabetes Mother   . Kidney disease Mother     Renal Insuff  . Heart disease Mother     CAD  . COPD Mother   . Peripheral vascular disease Mother   . Hypertension Brother   . Lupus Sister   . COPD Sister   . Mental illness Sister     Bipolar  . Prostate cancer Paternal Uncle   . Colon cancer Neg Hx     Current Outpatient Prescriptions  Medication Sig Dispense Refill  . Garlic (ODOR FREE GARLIC) 100 MG TABS Take by mouth daily.        Marland Kitchen  glucose blood (ONE TOUCH ULTRA TEST) test strip Check blood sugar daily as directed. 250.00  100 each  3  . ibuprofen (ADVIL) 200 MG tablet Take 200 mg by mouth as needed.        . metFORMIN (GLUCOPHAGE) 500 MG tablet Take 1 tablet (500 mg total) by mouth 2 (two) times daily.  180 tablet  3  . Omega-3 Fatty Acids (FISH OIL) 1200 MG CAPS Take by mouth daily.        . ONE TOUCH LANCETS MISC Check blood sugar daily as directed.250.00  200 each  3  . [DISCONTINUED] pravastatin (PRAVACHOL) 40 MG tablet Take 1 tablet (40 mg total) by mouth every evening.  90 tablet  3   No current facility-administered medications for this visit.     Allergies  Allergen Reactions  . Contrast Media (Iodinated Diagnostic Agents)     rash  . Pravastatin     myalgias  . Simvastatin     myalgias    BP 110/62  Pulse 80  Resp 18  Ht 5\' 7"  (1.702 m)  Wt 174 lb (78.926 kg)   BMI 27.25 kg/m2  No results found.   Review of Systems  Constitutional: Negative for fever, chills and diaphoresis.  HENT: Negative for nosebleeds, sore throat, facial swelling, mouth sores, trouble swallowing and ear discharge.   Eyes: Negative for photophobia, discharge and visual disturbance.  Respiratory: Negative for choking, chest tightness, shortness of breath and stridor.   Cardiovascular: Negative for chest pain and palpitations.  Gastrointestinal: Negative for nausea, vomiting, abdominal pain, diarrhea, constipation, blood in stool, abdominal distention, anal bleeding and rectal pain.  Genitourinary: Negative for dysuria, urgency, difficulty urinating and testicular pain.  Musculoskeletal: Negative for myalgias, back pain, arthralgias and gait problem.  Skin: Negative for color change, pallor, rash and wound.  Neurological: Negative for dizziness, speech difficulty, weakness, numbness and headaches.  Hematological: Negative for adenopathy. Does not bruise/bleed easily.  Psychiatric/Behavioral: Negative for hallucinations, confusion and agitation.       Objective:   Physical Exam  Constitutional: He is oriented to person, place, and time. He appears well-developed and well-nourished. No distress.  HENT:  Head: Normocephalic.  Mouth/Throat: Oropharynx is clear and moist. No oropharyngeal exudate.  Eyes: Conjunctivae and EOM are normal. Pupils are equal, round, and reactive to light. No scleral icterus.  Neck: Normal range of motion. Neck supple. No tracheal deviation present.  Cardiovascular: Normal rate, regular rhythm and intact distal pulses.   Pulmonary/Chest: Effort normal and breath sounds normal. No respiratory distress.  Abdominal: Soft. He exhibits no distension. There is no tenderness. A hernia is present. Hernia confirmed positive in the right inguinal area and confirmed positive in the left inguinal area. Hernia confirmed negative in the ventral area.     Musculoskeletal: Normal range of motion. He exhibits no tenderness.  Lymphadenopathy:    He has no cervical adenopathy.       Right: No inguinal adenopathy present.       Left: No inguinal adenopathy present.  Neurological: He is alert and oriented to person, place, and time. No cranial nerve deficit. He exhibits normal muscle tone. Coordination normal.  Skin: Skin is warm and dry. No rash noted. He is not diaphoretic. No erythema. No pallor.  Psychiatric: He has a normal mood and affect. His behavior is normal. Judgment and thought content normal.       Assessment:     Probable bilateral indirect inguinal hernias on Valsalva only.  History very  suspicious.     Plan:     Given his moderate activity and smoking and diabetes, I noted I would air on the side of diagnostic laparoscopy with repair of hernias on the left and probable right sides.  The other option is to do anti-inflammatories and heat in low-impact activity three weeks and see if everything completely resolves, but I am skeptical that given the prior history and suspicious examination.  He wishes to be aggressive and proceed with surgery.  His father had a large hernia that never got it fixed and caused a lot of problems in his later years when he could not tolerate surgery.  He wants to check with work and coordinate a time within the next few months.  I discussed with him what to look for in emergency situations.  I discussed the procedure with him:  The anatomy & physiology of the abdominal wall and pelvic floor was discussed.  The pathophysiology of hernias in the inguinal and pelvic region was discussed.  Natural history risks such as progressive enlargement, pain, incarceration & strangulation was discussed.   Contributors to complications such as smoking, obesity, diabetes, prior surgery, etc were discussed.    I feel the risks of no intervention will lead to serious problems that outweigh the operative risks; therefore,  I recommended surgery to reduce and repair the hernia.  I explained laparoscopic techniques with possible need for an open approach.  I noted usual use of mesh to patch and/or buttress hernia repair  Risks such as bleeding, infection, abscess, need for further treatment, heart attack, death, and other risks were discussed.  I noted a good likelihood this will help address the problem.   Goals of post-operative recovery were discussed as well.  Possibility that this will not correct all symptoms was explained.  I stressed the importance of low-impact activity, aggressive pain control, avoiding constipation, & not pushing through pain to minimize risk of post-operative chronic pain or injury. Possibility of reherniation was discussed.  We will work to minimize complications.     An educational handout further explaining the pathology & treatment options was given as well.  Questions were answered.  The patient expresses understanding & wishes to proceed with surgery.  We talked to the patient about the dangers of smoking.  We stressed that tobacco use dramatically increases the risk of peri-operative complications such as infection, tissue necrosis leaving to problems with incision/wound and organ healing, heart attack, stroke, DVT, pulmonary embolism, and death.  We noted there are programs in our community to help stop smoking.  He is not interested in at this time.  He thinks drinking a lot of vinegar & being very active helps negate the effect of smoking.

## 2012-12-13 NOTE — Addendum Note (Signed)
Addended by: Ardeth Sportsman on: 12/13/2012 09:19 AM   Modules accepted: Orders

## 2013-02-23 ENCOUNTER — Encounter: Payer: Self-pay | Admitting: Family Medicine

## 2013-02-23 ENCOUNTER — Telehealth: Payer: Self-pay | Admitting: Family Medicine

## 2013-02-23 ENCOUNTER — Ambulatory Visit (INDEPENDENT_AMBULATORY_CARE_PROVIDER_SITE_OTHER): Payer: BC Managed Care – PPO | Admitting: Family Medicine

## 2013-02-23 VITALS — BP 120/80 | HR 80 | Temp 97.7°F | Wt 170.0 lb

## 2013-02-23 DIAGNOSIS — W57XXXA Bitten or stung by nonvenomous insect and other nonvenomous arthropods, initial encounter: Secondary | ICD-10-CM

## 2013-02-23 DIAGNOSIS — T148 Other injury of unspecified body region: Secondary | ICD-10-CM

## 2013-02-23 MED ORDER — DOXYCYCLINE HYCLATE 100 MG PO TABS: 100.0000 mg | ORAL_TABLET | Freq: Two times a day (BID) | ORAL | Status: DC

## 2013-02-23 NOTE — Patient Instructions (Signed)
I would start the antibiotics today.  If the rash comes back, then let me know.   Take care.

## 2013-02-23 NOTE — Progress Notes (Signed)
Mult tick bites.  First noted ~3 weeks ago.  Total 7 tick bites.  All attached, removed with tweezers.  Hands and feet started itching today.  Soles of feet were red earlier today.  He had some transient redness on the L anterior thigh today- almost totally resolved by OV.  R hand joint aches noted recently.  No fevers. He had a HA prev, but it resolved.   Tick bite sites: 6 on the abdomen and upper back, 1 on the R lower leg.    Meds, vitals, and allergies reviewed.   ROS: See HPI.  Otherwise, noncontributory.  nad ncat Neck supple, no LA Mmm rrr ctab abd soft, not ttp No rash, mult healing tick bite sites are noted w/o retained FB

## 2013-02-23 NOTE — Telephone Encounter (Signed)
Will see today.  

## 2013-02-23 NOTE — Assessment & Plan Note (Signed)
Multiple, attached, no retained FB noted.  Given the transient rash and the mult exposures, I would cover with doxy.  Routine cautions, ie sun exposure, given.  F/u prn.  Nontoxic. He agrees with plan.

## 2013-02-23 NOTE — Telephone Encounter (Signed)
Patient Information:  Caller Name: Jamon  Phone: [phone]  Patient: Gary Everett, Gary Everett  Gender: Male  DOB: 1955/12/18  Age: 57 Years  PCP: Crawford Givens Clelia Croft) [facility])  Office Follow Up:  Does the office need to follow up with this patient?: No  Instructions For The Office: N/A   Symptoms  Reason For Call & Symptoms: pt reports that he has had multiple tick bites ( deer ticks).  Most recent was 4-5 days ago.  Pt reports he woke up last night with feet tingling and hands tingling.  Pt also states that he has a rash on his leg now.  Reviewed Health History In EMR: Yes  Reviewed Medications In EMR: Yes  Reviewed Allergies In EMR: Yes  Reviewed Surgeries / Procedures: Yes  Date of Onset of Symptoms: 02/22/2013  Guideline(s) Used:  Tick Bite  Disposition Per Guideline:   See Today in Office  Reason For Disposition Reached:   Patient wants to be seen  Advice Given:  Antibiotic Ointment:  Wash the wound and your hands with soap and water after removal to prevent catching any tick disease. Apply an over-the-counter antibiotic ointment (e.g., bacitracin) to the bite once.  Call Back If:  You become worse.  Patient Will Follow Care Advice:  YES  Appointment Scheduled:  02/23/2013 16:15:00 Appointment Scheduled Provider:  Crawford Givens Clelia Croft) [facility])

## 2013-04-11 ENCOUNTER — Other Ambulatory Visit: Payer: Self-pay | Admitting: *Deleted

## 2013-04-11 MED ORDER — METFORMIN HCL 500 MG PO TABS: 500.0000 mg | ORAL_TABLET | Freq: Two times a day (BID) | ORAL | Status: DC

## 2013-04-11 MED ORDER — GLUCOSE BLOOD VI STRP: ORAL_STRIP | Status: DC

## 2013-04-15 ENCOUNTER — Telehealth: Payer: Self-pay

## 2013-04-15 NOTE — Telephone Encounter (Signed)
Pt brought in form for [facility] with ck for payment and requested refill of metformin and diabetic test strips to [facility]. Advised pt metformin and diabetic test strips were sent electronically to [facility] on 04/11/13. Pt will mail payment himself to [facility]. I did not take check from pt.

## 2013-05-27 ENCOUNTER — Telehealth: Payer: Self-pay

## 2013-05-27 NOTE — Telephone Encounter (Signed)
Noted.  Pt declined [facility]CC eval.

## 2013-05-27 NOTE — Telephone Encounter (Signed)
Pt said diarrhea started 14 hrs ago and last 2 diarrhea stools had bright red blood, lower abd pain on and off. No fever, N or V, dry mouth or dizziness. Pt has not taken med for diarrhea. Advised pt can go to UC; pt said he does not want to go to UC. Offered appt at [facility] and pt said he did not want to do that either. Pt said if he got worse he would go to ED.

## 2013-05-28 ENCOUNTER — Inpatient Hospital Stay: Payer: Self-pay | Admitting: Internal Medicine

## 2013-05-28 LAB — URINALYSIS, COMPLETE
Bilirubin,UR: NEGATIVE
Ketone: NEGATIVE
Nitrite: NEGATIVE
Ph: 7 (ref 4.5–8.0)
Specific Gravity: 1.004 (ref 1.003–1.030)

## 2013-05-28 LAB — COMPREHENSIVE METABOLIC PANEL
Anion Gap: 4 — ABNORMAL LOW (ref 7–16)
BUN: 11 mg/dL (ref 7–18)
Bilirubin,Total: 0.5 mg/dL (ref 0.2–1.0)
Chloride: 105 mmol/L (ref 98–107)
Co2: 29 mmol/L (ref 21–32)
Creatinine: 0.96 mg/dL (ref 0.60–1.30)
EGFR (African American): >60
Glucose: 111 mg/dL — ABNORMAL HIGH (ref 65–99)
Potassium: 3.9 mmol/L (ref 3.5–5.1)
SGOT(AST): 13 U/L — ABNORMAL LOW (ref 15–37)
SGPT (ALT): 30 U/L (ref 12–78)
Total Protein: 7.5 g/dL (ref 6.4–8.2)

## 2013-05-28 LAB — CBC
HGB: 16.1 g/dL (ref 13.0–18.0)
MCH: 30.9 pg (ref 26.0–34.0)
MCHC: 33.9 g/dL (ref 32.0–36.0)
Platelet: 279 10*3/uL (ref 150–440)

## 2013-05-28 LAB — HEMOGLOBIN: HGB: 13.7 g/dL (ref 13.0–18.0)

## 2013-05-28 NOTE — Telephone Encounter (Signed)
Noted, try to get update on pt Monday.  Thanks.

## 2013-05-29 LAB — BASIC METABOLIC PANEL
Calcium, Total: 8.2 mg/dL — ABNORMAL LOW (ref 8.5–10.1)
Chloride: 108 mmol/L — ABNORMAL HIGH (ref 98–107)
EGFR (African American): >60
EGFR (Non-African Amer.): >60
Glucose: 111 mg/dL — ABNORMAL HIGH (ref 65–99)
Osmolality: 277 (ref 275–301)
Potassium: 4 mmol/L (ref 3.5–5.1)
Sodium: 138 mmol/L (ref 136–145)

## 2013-05-29 LAB — CBC WITH DIFFERENTIAL/PLATELET
Basophil #: 0 10*3/uL (ref 0.0–0.1)
Basophil #: 0.1 10*3/uL (ref 0.0–0.1)
Basophil %: 0.7 %
HGB: 12.6 g/dL — ABNORMAL LOW (ref 13.0–18.0)
HGB: 12.8 g/dL — ABNORMAL LOW (ref 13.0–18.0)
Lymphocyte %: 30.1 %
MCH: 31.5 pg (ref 26.0–34.0)
MCHC: 34.9 g/dL (ref 32.0–36.0)
MCHC: 35 g/dL (ref 32.0–36.0)
MCV: 90 fL (ref 80–100)
Monocyte #: 0.5 x10 3/mm (ref 0.2–1.0)
Monocyte #: 0.5 x10 3/mm (ref 0.2–1.0)
Monocyte %: 7.1 %
Neutrophil %: 58.3 %
Neutrophil %: 70.2 %
Platelet: 217 10*3/uL (ref 150–440)
RDW: 13 % (ref 11.5–14.5)

## 2013-05-30 LAB — CBC WITH DIFFERENTIAL/PLATELET
Basophil %: 0.5 %
Eosinophil #: 0.3 10*3/uL (ref 0.0–0.7)
HGB: 13 g/dL (ref 13.0–18.0)
Lymphocyte %: 27.8 %
MCH: 32 pg (ref 26.0–34.0)
MCHC: 35.6 g/dL (ref 32.0–36.0)
Monocyte %: 7.3 %
Neutrophil %: 60.1 %
Platelet: 224 10*3/uL (ref 150–440)
RDW: 13.1 % (ref 11.5–14.5)

## 2013-05-30 LAB — BASIC METABOLIC PANEL
BUN: 17 mg/dL (ref 7–18)
Calcium, Total: 8.4 mg/dL — ABNORMAL LOW (ref 8.5–10.1)
Chloride: 109 mmol/L — ABNORMAL HIGH (ref 98–107)
Co2: 27 mmol/L (ref 21–32)
Creatinine: 1.22 mg/dL (ref 0.60–1.30)
Glucose: 115 mg/dL — ABNORMAL HIGH (ref 65–99)
Osmolality: 284 (ref 275–301)
Potassium: 4 mmol/L (ref 3.5–5.1)

## 2013-05-30 NOTE — Telephone Encounter (Signed)
Noted, thanks!

## 2013-05-30 NOTE — Telephone Encounter (Signed)
Patient went to ER and was admitted, leaving the hospital today.  He is scheduled to FU with GI at [facility] in 2 weeks for a possible colonoscopy.

## 2013-06-05 ENCOUNTER — Encounter: Payer: Self-pay | Admitting: Family Medicine

## 2013-06-05 DIAGNOSIS — K529 Noninfective gastroenteritis and colitis, unspecified: Secondary | ICD-10-CM | POA: Insufficient documentation

## 2013-06-08 ENCOUNTER — Ambulatory Visit (INDEPENDENT_AMBULATORY_CARE_PROVIDER_SITE_OTHER): Payer: BC Managed Care – PPO | Admitting: Family Medicine

## 2013-06-08 ENCOUNTER — Encounter: Payer: Self-pay | Admitting: Family Medicine

## 2013-06-08 VITALS — BP 107/60 | HR 79 | Temp 97.9°F | Wt 169.0 lb

## 2013-06-08 DIAGNOSIS — E119 Type 2 diabetes mellitus without complications: Secondary | ICD-10-CM

## 2013-06-08 DIAGNOSIS — K529 Noninfective gastroenteritis and colitis, unspecified: Secondary | ICD-10-CM

## 2013-06-08 DIAGNOSIS — K922 Gastrointestinal hemorrhage, unspecified: Secondary | ICD-10-CM

## 2013-06-08 DIAGNOSIS — K5289 Other specified noninfective gastroenteritis and colitis: Secondary | ICD-10-CM

## 2013-06-08 DIAGNOSIS — M5137 Other intervertebral disc degeneration, lumbosacral region: Secondary | ICD-10-CM

## 2013-06-08 NOTE — Assessment & Plan Note (Signed)
A1c at goal.  No ADE on meds. Labs d/w pt.

## 2013-06-08 NOTE — Assessment & Plan Note (Signed)
Advised to stop nsaids, keep f/u with GI, recheck CBC today.  Hospital recs reviewed and d/w pt.  Improved.

## 2013-06-08 NOTE — Assessment & Plan Note (Signed)
With continued back pain, will address after GI f/u is done. He agrees.  We can refer back to Dr. Channing Mutters if needed.

## 2013-06-08 NOTE — Patient Instructions (Signed)
Go to the lab on the way out.  We'll contact you with your lab report. Keep the appointment with GI.  When you get through that and want to go see Dr. Channing Mutters, then let me know.  Take care.

## 2013-06-08 NOTE — Progress Notes (Signed)
Smoking less now, about 4 cigs a day.  Cutting back.  Discussed.   His sugar has been controlled, ~118-130 in AM.  A1c 6.5 in 8/14.   Labs reviewed.   GIB, BRBPR.  Admitted with likely ischemic colitis on CT, per inpatient MD report.  GI f/u pending.  Was treated with flagyl and cipro inpatient. BM since coming home w/o red blood. Stools were dark but then in last 3-4 days normal texture and lighter color.  He is taking the naproxen again in the meantime.  I advised him to stop it for now and use tylenol instead.  Not lightheaded.  No abd pain.   Renal stones noted on CT in hospital.   Back pain continues with numbness in R leg.  He is trying exercise at home.  He went to the chiropractor.  Back belt/support helps some.  Pain at R of midline, at the belt. No radicular pain but R thigh is numb.    Meds, vitals, and allergies reviewed.   ROS: See HPI.  Otherwise, noncontributory.  nad ncat Mmm rrr ctab abd soft, not ttp Ext w/o edema Back w/o midline tenderness but pain with walking No weakness in the BLE

## 2013-06-09 LAB — CBC WITH DIFFERENTIAL/PLATELET
Basophils Relative: 1.8 % (ref 0.0–3.0)
Eosinophils Relative: 3 % (ref 0.0–5.0)
HCT: 42.4 % (ref 39.0–52.0)
Lymphs Abs: 1.7 10*3/uL (ref 0.7–4.0)
Monocytes Relative: 7 % (ref 3.0–12.0)
Neutrophils Relative %: 60.4 % (ref 43.0–77.0)
Platelets: 320 10*3/uL (ref 150.0–400.0)
RBC: 4.59 Mil/uL (ref 4.22–5.81)
WBC: 6 10*3/uL (ref 4.5–10.5)

## 2013-06-10 ENCOUNTER — Encounter: Payer: Self-pay | Admitting: Family Medicine

## 2013-06-10 LAB — HEMOGLOBIN A1C
A1c: 6.5
Cholesterol, Total: 221
LDL (calc): 158
T4, Total: 10.4
Triglycerides: 126

## 2013-07-25 ENCOUNTER — Other Ambulatory Visit: Payer: Self-pay | Admitting: *Deleted

## 2013-07-25 MED ORDER — METFORMIN HCL 500 MG PO TABS: 500.0000 mg | ORAL_TABLET | Freq: Two times a day (BID) | ORAL | Status: DC

## 2013-09-12 ENCOUNTER — Other Ambulatory Visit: Payer: BC Managed Care – PPO

## 2013-09-19 ENCOUNTER — Other Ambulatory Visit: Payer: Self-pay | Admitting: Family Medicine

## 2013-09-19 ENCOUNTER — Encounter: Payer: BC Managed Care – PPO | Admitting: Family Medicine

## 2013-09-19 DIAGNOSIS — E119 Type 2 diabetes mellitus without complications: Secondary | ICD-10-CM

## 2013-09-19 DIAGNOSIS — Z125 Encounter for screening for malignant neoplasm of prostate: Secondary | ICD-10-CM

## 2013-09-27 ENCOUNTER — Other Ambulatory Visit: Payer: BC Managed Care – PPO

## 2013-09-28 ENCOUNTER — Other Ambulatory Visit: Payer: BC Managed Care – PPO

## 2013-10-04 ENCOUNTER — Encounter: Payer: BC Managed Care – PPO | Admitting: Family Medicine

## 2013-10-25 ENCOUNTER — Other Ambulatory Visit: Payer: Self-pay | Admitting: Family Medicine

## 2013-10-25 MED ORDER — METFORMIN HCL 500 MG PO TABS: 500.0000 mg | ORAL_TABLET | Freq: Two times a day (BID) | ORAL | Status: DC

## 2013-10-25 NOTE — Telephone Encounter (Signed)
Pt left voicemail with triage requesting new Rx for metformin sent to [facility] pharmacy. Done and pt notified

## 2013-11-04 ENCOUNTER — Encounter: Payer: Self-pay | Admitting: Family Medicine

## 2013-11-04 ENCOUNTER — Ambulatory Visit (INDEPENDENT_AMBULATORY_CARE_PROVIDER_SITE_OTHER): Payer: BC Managed Care – PPO | Admitting: Family Medicine

## 2013-11-04 VITALS — BP 116/80 | HR 80 | Temp 97.6°F | Ht 67.0 in | Wt 170.5 lb

## 2013-11-04 DIAGNOSIS — Z0000 Encounter for general adult medical examination without abnormal findings: Secondary | ICD-10-CM

## 2013-11-04 DIAGNOSIS — Z125 Encounter for screening for malignant neoplasm of prostate: Secondary | ICD-10-CM

## 2013-11-04 DIAGNOSIS — Z23 Encounter for immunization: Secondary | ICD-10-CM

## 2013-11-04 DIAGNOSIS — E119 Type 2 diabetes mellitus without complications: Secondary | ICD-10-CM

## 2013-11-04 LAB — LIPID PANEL
CHOLESTEROL: 183 mg/dL (ref 0–200)
HDL: 44.5 mg/dL (ref >39.00)
LDL CALC: 123 mg/dL — AB (ref 0–99)
Total CHOL/HDL Ratio: 4
Triglycerides: 80 mg/dL (ref 0.0–149.0)
VLDL: 16 mg/dL (ref 0.0–40.0)

## 2013-11-04 LAB — MICROALBUMIN / CREATININE URINE RATIO
CREATININE, U: 35.5 mg/dL
MICROALB UR: 0.2 mg/dL (ref 0.0–1.9)
MICROALB/CREAT RATIO: 0.6 mg/g (ref 0.0–30.0)

## 2013-11-04 LAB — HEMOGLOBIN A1C: HEMOGLOBIN A1C: 6.7 % — AB (ref 4.6–6.5)

## 2013-11-04 LAB — PSA: PSA: 0.26 ng/mL (ref 0.10–4.00)

## 2013-11-04 MED ORDER — METFORMIN HCL 500 MG PO TABS: 500.0000 mg | ORAL_TABLET | Freq: Two times a day (BID) | ORAL | Status: DC

## 2013-11-04 MED ORDER — GLUCOSE BLOOD VI STRP: ORAL_STRIP | Status: DC

## 2013-11-04 MED ORDER — ONETOUCH LANCETS MISC: Status: DC

## 2013-11-04 NOTE — Patient Instructions (Addendum)
Go to the lab on the way out.  We'll contact you with your lab report. Take care.  Recheck A1c in about 6 months before a visit.  Glad to see you.

## 2013-11-04 NOTE — Assessment & Plan Note (Signed)
Controlled by home checks, see notes on labs. PNA vaccine today.

## 2013-11-04 NOTE — Progress Notes (Signed)
Pre-visit discussion using our clinic review tool. No additional management support is needed unless otherwise documented below in the visit note.  CPE- See plan.  Routine anticipatory guidance given to patient.  See health maintenance. Tetanus 2015 Shingles usually done at 26.  PNA 2015 Flu shot prev done 2014 PSA pending.  FH noted.  Colonoscopy done 07/17/10.   Living will d/w pt.  Wife designated if he were incapacitated. Diet and exercise d/w pt.  "I was doing good until everything else happened."  Sister was dx'd with Ezequiel Ganser.  She is in long term care facility currently.  Discussed.   He is cutting back smoking.    Diabetes:  Using medications without difficulties:yes Hypoglycemic episodes:rare, if prolonged fasting.  Hyperglycemic episodes:no Feet problems:no Blood Sugars averaging: usually ~120s in AM eye exam within last year: due, discussed, encouraged.   Due for labs.    PMH and SH reviewed  Meds, vitals, and allergies reviewed.   ROS: See HPI.  Otherwise negative.    GEN: nad, alert and oriented HEENT: mucous membranes moist NECK: supple w/o LA CV: rrr. PULM: ctab, no inc wob ABD: soft, +bs EXT: no edema SKIN: no acute rash  Diabetic foot exam: Normal inspection No skin breakdown No calluses  Normal DP pulses Normal sensation to light touch and monofilament Nails normal

## 2013-11-04 NOTE — Assessment & Plan Note (Signed)
Routine anticipatory guidance given to patient.  See health maintenance. Tetanus 2015 Shingles usually done at 19.  PNA 2015 Flu shot prev done 2014 PSA pending.  FH noted.  Colonoscopy done 07/17/10.   Living will d/w pt.  Wife designated if he were incapacitated. Diet and exercise d/w pt.  "I was doing good until everything else happened."  Sister was dx'd with Ezequiel Ganser.  She is in long term care facility currently.  Discussed.   He is cutting back smoking.  Discussed.

## 2013-11-07 ENCOUNTER — Telehealth: Payer: Self-pay | Admitting: Family Medicine

## 2013-11-07 NOTE — Telephone Encounter (Signed)
Relevant patient education mailed to patient.  

## 2013-11-10 ENCOUNTER — Encounter: Payer: Self-pay | Admitting: *Deleted

## 2013-11-21 ENCOUNTER — Telehealth: Payer: Self-pay | Admitting: Family Medicine

## 2013-11-21 DIAGNOSIS — M549 Dorsalgia, unspecified: Secondary | ICD-10-CM

## 2013-11-21 NOTE — Telephone Encounter (Signed)
Referral is in. Thanks.

## 2013-11-21 NOTE — Telephone Encounter (Signed)
Pt recently slipped a disc in his back and is needing refill to Dr. Carloyn Manner with [facility] in [place]. Fax number is [phone].

## 2013-11-23 NOTE — Telephone Encounter (Signed)
Patient needs an office visit first before Neurosurgeon can be referred.

## 2013-11-24 ENCOUNTER — Ambulatory Visit: Payer: BC Managed Care – PPO | Admitting: Family Medicine

## 2013-11-28 ENCOUNTER — Ambulatory Visit: Payer: BC Managed Care – PPO | Admitting: Family Medicine

## 2014-05-05 ENCOUNTER — Ambulatory Visit: Payer: BC Managed Care – PPO | Admitting: Family Medicine

## 2014-06-26 ENCOUNTER — Encounter: Payer: Self-pay | Admitting: Gastroenterology

## 2015-01-12 NOTE — Discharge Summary (Signed)
PATIENT NAME:  Gary Everett, Gary Everett MR#:  [id number] DATE OF BIRTH:  05/09/1956  DATE OF ADMISSION:  05/28/2013 DATE OF DISCHARGE:  05/30/2013  PRIMARY CARE PHYSICIAN: Elsie Stain, MD   GASTROENTEROLOGIST:  Arther Dames, MD  DISCHARGE DIAGNOSES:  1.  Abdominal pain, colitis and rectal bleeding.  2.  Diabetes.  3.  Nephrolithiasis.  4.  Low back pain.  MEDICATIONS ON DISCHARGE INCLUDE: Metformin 500 mg twice a day, fish oil 1200 mg daily, magnesium oxide 400 mg daily, metronidazole 250 mg every 8 hours for 3 days, Percocet 5/325 1 tablet every 6 hours as needed for pain, Cipro 500 mg every 12 hours for 3 days.   DIET: Carbohydrate-controlled diet, regular consistency.   ACTIVITY: As tolerated.   FOLLOWUP: With Dr. Rayann Heman, Gastroenterology, and Dr. Elsie Stain as outpatient.   HISTORY OF PRESENT ILLNESS: The patient was admitted 05/28/2013 and discharged 05/30/2013, came in with diarrhea and blood in the stool. A CT scan was consistent with colitis. He was started on empiric Cipro and Flagyl and Zofran, given IV fluid hydration. Serial hemoglobins. Consult by Dr. Rayann Heman, Gastroenterology, was obtained. Metformin was put on hold.   LABORATORY AND RADIOLOGICAL DATA DURING THE HOSPITAL COURSE:  Included a white blood cell count of 9.9, H and H 16.1 and 47.4, platelet count of 279. Glucose 111, BUN 11, creatinine 0.96, sodium 138, potassium 3.9, chloride 105, CO2 29, calcium 9.4. Liver function tests normal range. Urinalysis: Trace leukocyte esterase.  CT scan of the abdomen and pelvis for stone protocol showed findings consistent with colitis in the descending colon and proximal sigmoid colon.  Right nephrolithiasis is noted but not measured on CT scan.  Hemoglobin upon discharge 13, white count 7. Glucose 115, BUN 17, creatinine 1.22, sodium 141, potassium 4.0, chloride 109, CO2 27.   HOSPITAL COURSE PER PROBLEM LIST:  1.  For the patient's abdominal pain, colitis and bleeding, the patient did have  severe abdominal pain, was found to have colitis on CT scan. He did have a pretty profuse bleeding initially. The patient was given vigorous IV fluid hydration. The patient's blood counts did come down, but it is probably dilutional in nature because the patient did not have 1 bowel movement during the entire hospital course. Stool studies were never sent since he never had a bowel movement. I spoke with Dr. Rayann Heman.  He believes that this is ischemic colitis and will follow him up for a colonoscopy as outpatient. Since the patient was started on antibiotics, I just finished up a course while here. The patient upon discharge had no abdominal pain, no further bleeding. 2.  For the patient's diabetes, metformin can be restarted as outpatient. 3.  For the patient's nephrolithiasis, no pain with regards to this.  The patient was given IV fluid hydration. 4.  For the patient's low back pain, I told the patient not to have any anti-inflammatories at this point, including the diclofenac that he takes. I did give him Percocet as needed for his back pain.  TIME SPENT ON DISCHARGE: 35 minutes.   ____________________________ Tana Conch. Leslye Peer, MD rjw:cb D: 05/30/2013 15:45:01 ET T: 05/30/2013 16:00:54 ET JOB#: [id number]  cc: Tana Conch. Leslye Peer, MD, <Dictator> Elveria Rising. Damita Dunnings, MD Arther Dames, MD Marisue Brooklyn MD ELECTRONICALLY SIGNED 06/03/2013 16:14

## 2015-01-12 NOTE — H&P (Signed)
PATIENT NAME:  Gary Everett, Gary Everett MR#:  [id number] DATE OF BIRTH:  1956-01-13  DATE OF ADMISSION:  05/28/2013  PRIMARY CARE PHYSICIAN: Dr. Elsie Stain at [facility]  REFERRING EMERGENCY ROOM PHYSICIAN: Sheryl L. Benjaman Lobe, MD  CHIEF COMPLAINT: Diarrhea, blood in the stool.   HISTORY OF PRESENT ILLNESS: This is a 59 year old male with a past medical history of diabetes, high cholesterol, was in his normal usual health until last week. Since 1 week, he started feeling diarrhea. Initially he thought it is due to his salads,  so we cut down on his salad intake, which helped just a little bit in his diarrhea but then again 2 to 3 days ago, he started having diarrhea again and yesterday morning, he noticed that there is not any stool but only blood coming out, which later on was mixed with some secretions and blood. He was going to the bathroom almost every 45 minutes to an hour, almost 30 episodes of loose bowel movement mixed with blood yesterday, slowly getting less and less amount of blood until this morning, so he decided to come to the Emergency Room today. He felt a little nauseous but did not vomit anything, and he did not eat anything for the last 2 days because of this diarrhea and fear of having blood in the stool. In the ER, a CT scan of the abdomen was done, which confirmed colitis in descending and sigmoid colon. On further questioning, he feels that he had some weakness today but did not have any fever or chills. He had colonoscopy done 2 years ago at [facility], some gastroenterologist associated with [facility], and he was found having diverticula at that time.  REVIEW OF SYSTEMS:    CONSTITUTIONAL: Negative for fever, fatigue. Positive for mild weakness today and positive for pain. The patient also complains he had lower abdomen pain, 10 out of 10, sometimes sharp, otherwise dull, and which was on and off multiple times yesterday.  EYES: No blurring or double vision, pain or redness.   EARS, NOSE, THROAT: No tinnitus, ear pain or hearing loss.  RESPIRATORY: No cough, wheezing, hemoptysis or shortness of breath.  CARDIOVASCULAR: No chest pain, orthopnea, edema or arrhythmia.  GASTROINTESTINAL: Mild nausea and has diarrhea. No vomiting. Some blood in the stool present. Lower abdominal pain present.  GENITOURINARY: No dysuria, hematuria or increased frequency.  ENDOCRINE: No increased sweating, heat or cold intolerance.  SKIN: No acne, rashes or lesions on the skin.  MUSCULOSKELETAL: No pain or swelling in the joints.  NEUROLOGICAL: No numbness, weakness, tremors or rigidity.  PSYCHIATRIC: No anxiety, insomnia or bipolar disorder.   PAST MEDICAL HISTORY: Diabetes, high cholesterol.   PAST SURGICAL HISTORY: Some nasal surgery for sinuses, had neck surgery for ruptured disk and multiple times ultrasound-guided lithotripsy and surgery for kidney stones.   ALLERGIES: SHELLFISH AND DYE.   HOME MEDICATIONS: Metformin and fish oil.   SOCIAL HISTORY: He is a retired Science writer. He was smoking 1 pack per day for many years, now he cut down to 1/2 pack, total smoking period 45 years. Denies any alcohol or illegal drug use.   FAMILY HISTORY: On his mother's side, multiple family members had coronary artery disease, stroke and diabetes. On his father's side, almost all the males had prostate cancer and some other cancers.   PHYSICAL EXAMINATION:  VITAL SIGNS: In ER, temperature 97.7, pulse rate 73, respirations 18, blood pressure 132/81,  pulse oximetry 99 on room air.  GENERAL: The  patient is fully alert and oriented to time, place and person. Does not appear in any acute distress.  HEENT: Head and neck atraumatic. Conjunctivae pink. Oral mucosa moist.  NECK: Supple. No JVD.  RESPIRATORY: Bilateral clear and equal air entry.  CARDIOVASCULAR: S1, S2 present, regular. No murmur.  ABDOMEN: Soft. Mild tenderness in lower abdomen and left side. Bowel sounds  present. No organomegaly.  SKIN: No rashes.  EXTREMITIES: Legs: No edema.  NEUROLOGICAL: Power 5/5. Follows commands. No tremors. Moves all 4 limbs.  PSYCHIATRIC: Does not appear in any acute psychiatric illness.   IMPORTANT LABORATORY AND IMAGING RESULTS: CT scan of the abdomen and pelvis: Findings consistent with colitis in descending colon and proximal sigmoid colon. Right nephrolithiasis noted. Other lab results: Glucose 111, BUN 11, creatinine 0.96, sodium 138, potassium 3.9, chloride 105, CO2 of 29, calcium 9.4, total protein 7.5, bilirubin 0.5, alk phos 81, SGOT 13, SGPT 30. White cell count 9.9, hemoglobin 16.1, platelet count 279 and MCV 91. Urinalysis is grossly negative.   ASSESSMENT AND PLAN: A 59 year old male with past medical history of diabetes and hypercholesterolemia and was found having diverticula 2 years ago on colonoscopy. Started having diarrhea and noticed some blood yesterday, almost 20 to 30 episodes of loose stool mixed with blood yesterday, having lower abdominal pain and feeling nauseous. CT scan confirms descending colon and sigmoid colon colitis.  1.  Colitis: Will give him IV Cipro and Flagyl and Zofran for nausea. Will give him pain management with medication.  2.  Lower gastrointestinal bleed: Possible diverticular bleed as he had diverticula in the past. Will call GI consult and monitor his hemoglobin every 8 hourly. Currently hemoglobin is stable, but that might be component of dehydration also, so will keep on checking his hemoglobin. His stool is positive for guaiac by ER physician.  3.  Diabetes: He was taking metformin. Currently will hold it and put him on insulin sliding scale coverage.  4.  Hyperlipidemia: Will hold the medication at this point.  5.  Smoker: Smoking cessation counseling was done for 5 minutes and offered him nicotine patch but he says he will be fine. He does not want to have nicotine patch here in the hospital.  CODE STATUS: Full code.    TOTAL TIME SPENT: 50 minutes in this admission.   ____________________________ Ceasar Lund Anselm Jungling, MD vgv:jm D: 05/28/2013 12:45:04 ET T: 05/28/2013 13:10:39 ET JOB#: [id number]  cc: Ceasar Lund. Anselm Jungling, MD, <Dictator> Vaughan Basta MD ELECTRONICALLY SIGNED 05/28/2013 15:49

## 2015-01-12 NOTE — Consult Note (Signed)
PATIENT NAME:  Gary Everett, Gary Everett MR#:  [id number] DATE OF BIRTH:  11-Apr-1956  DATE OF CONSULTATION:  05/28/2013  REFERRING PHYSICIAN:  Dr. Vaughan Basta  CONSULTING PHYSICIAN:  Arther Dames, MD  REASON FOR CONSULT:  Diarrhea, rectal bleeding.  HISTORY OF PRESENT ILLNESS:  Gary Everett is a 59 year old male with a past medical history notable for diabetes and hypertension, who presented to urgent care this morning for evaluation of diarrhea and rectal bleeding. Gary Everett reports that the day before admission, he had 2 episodes of brown, watery diarrhea over the course of about 2 hours. Then after that, he started to have multiple episodes of diarrhea mixed with red stool. Then overnight, he continue to have very frequent, about every 45 minutes, episodes of mostly bright red blood. He does note that each of these episodes was usually preceded by abdominal pain. He would have abdominal pain then he would pass blood and stool and then abdominal pain. He would have abdominal pain, then he would pass blood in the stool, and then the abdominal pain would resolve. Each episode would then start again with lower abdominal pain.   He has never had a similar issue such as this. He reports a colonoscopy several years ago, with polyps. He is unsure of the type of polyps.   In the Emergency Room, he did have a CT done of his abdomen and pelvis, which showed some thickening in the descending colon. He is not having any fever or chills. Today he has not had any further rectal bleeding since about 9 or 10 this morning.   PAST MEDICAL HISTORY:   1.  Diabetes.  2.  Hypertension.  3.  Hyperlipidemia.   ALLERGIES:  SHELLFISH and DYE.  HOME MEDICATIONS:   1.  Metformin.  2.  Fish oil.  SOCIAL HISTORY:  He is a current ongoing smoker. He denies any alcohol or recreational drugs.   FAMILY HISTORY:  He denies a family history of colon cancer or other GI malignancy.   REVIEW OF SYSTEMS:   CONSTITUTIONAL: No weight  gain or weight loss.  No fever or chills. HEENT: No oral lesions or sore throat. No vision changes. GASTROINTESTINAL: See HPI.  HEME/LYMPH: No easy bruising or bleeding. CARDIOVASCULAR: No chest pain or dyspnea on exertion. GENITOURINARY: No hematuria. INTEGUMENTARY: No rashes or pruritus PSYCHIATRIC: No depression/anxiety.  ENDOCRINE: No heat/cold intolerance, no hair loss or skin changes. ALLERGIC/IMMUNOLOGIC: Negative for hives. RESPIRATORY: No cough, no shortness of breath.  MUSCULOSKELETAL: No joint swelling or muscle pain.  PHYSICAL EXAMINATION: VITAL SIGNS: Temperature is 96.8, pulse is 76, respirations are 16, blood pressure 122/80, pulse ox is 96% on room air.  GENERAL: Alert and oriented times 4.  No acute distress. Appears stated age. HEENT: Normocephalic/atraumatic. Extraocular movements are intact. Anicteric. NECK: Soft, supple. JVP appears normal. No adenopathy. CHEST: Clear to auscultation. No wheeze or crackle. Respirations unlabored. HEART: Regular. No murmur, rub, or gallop.  Normal S1 and S2. ABDOMEN: Soft, nondistended.  Normal active bowel sounds in all four quadrants.  No organomegaly. No masses. Mild diffuse tenderness.  EXTREMITIES: No swelling, well perfused. SKIN: No rash or lesion. Skin color, texture, turgor normal. NEUROLOGICAL: Grossly intact. PSYCHIATRIC: Normal tone and affect. MUSCULOSKELETAL: No joint swelling or erythema.   LABORATORY DATA:  Sodium 138, potassium 3.9, chloride 105, bicarb 29, BUN 11, creatinine 0.96. Total protein 7.5, albumin 4.32, total bili 0.5, alk phos is 81, AST 13, ALT 30. White count is 10, hemoglobin 16, hematocrit 47, platelets  are 279. CT of the abdomen and pelvis: Thickening of the wall of the descending colon and proximal sigmoid colon, with mild pericolonic inflammatory stranding.   ASSESSMENT AND PLAN:  Diarrhea, rectal bleeding, abdominal pain. The sequence of events of abdominal pain followed by diarrhea followed by  rectal bleeding is suggestive of ischemic colitis. However, it is unusual in that I do not a good reason why he would have been hypertensive and would have developed ischemic colitis. The other potential consideration would be an infectious etiology, which has caused colonic inflammation and now some rectal bleeding. It seems that whatever has been going on has likely resolved. I suspect this is not diverticulitis, as diverticulitis would be very unusual to also present with bleeding. I would recommend stool tests at this time if he has any further bowel movements. I think if he has an infectious etiology discovered, then there would be no further testing necessary. However, if he does not have an infectious etiology and the symptoms resolve, it would be prudent to perform a colonoscopy in about 6 weeks to further investigate the colonic thickening. For now, assuming he does not have any further bleeding or diarrhea, and he did no drop in hemoglobin, it would be safe to pursue this colonoscopy as an outpatient. For now, we will follow up as stool studies and continue to monitor his hemoglobin and hemodynamics. Thank you for this consult.    ____________________________ Arther Dames, MD mr:mr D: 05/28/2013 19:55:35 ET T: 05/28/2013 21:28:54 ET JOB#: [id number]  cc: Arther Dames, MD, <Dictator> Mellody Life MD ELECTRONICALLY SIGNED 06/01/2013 19:15

## 2015-01-16 ENCOUNTER — Other Ambulatory Visit: Payer: Self-pay | Admitting: Family Medicine

## 2015-01-16 DIAGNOSIS — Z125 Encounter for screening for malignant neoplasm of prostate: Secondary | ICD-10-CM

## 2015-01-16 DIAGNOSIS — E119 Type 2 diabetes mellitus without complications: Secondary | ICD-10-CM

## 2015-01-18 ENCOUNTER — Other Ambulatory Visit (INDEPENDENT_AMBULATORY_CARE_PROVIDER_SITE_OTHER): Payer: BLUE CROSS/BLUE SHIELD

## 2015-01-18 DIAGNOSIS — Z125 Encounter for screening for malignant neoplasm of prostate: Secondary | ICD-10-CM | POA: Diagnosis not present

## 2015-01-18 DIAGNOSIS — E119 Type 2 diabetes mellitus without complications: Secondary | ICD-10-CM | POA: Diagnosis not present

## 2015-01-18 LAB — LIPID PANEL
CHOLESTEROL: 184 mg/dL (ref 0–200)
HDL: 44.7 mg/dL (ref >39.00)
LDL CALC: 122 mg/dL — AB (ref 0–99)
NonHDL: 139.3
TRIGLYCERIDES: 89 mg/dL (ref 0.0–149.0)
Total CHOL/HDL Ratio: 4
VLDL: 17.8 mg/dL (ref 0.0–40.0)

## 2015-01-18 LAB — COMPREHENSIVE METABOLIC PANEL
ALT: 24 U/L (ref 0–53)
AST: 16 U/L (ref 0–37)
Albumin: 4.4 g/dL (ref 3.5–5.2)
Alkaline Phosphatase: 52 U/L (ref 39–117)
BUN: 16 mg/dL (ref 6–23)
CALCIUM: 9.7 mg/dL (ref 8.4–10.5)
CO2: 28 meq/L (ref 19–32)
Chloride: 104 mEq/L (ref 96–112)
Creatinine, Ser: 0.94 mg/dL (ref 0.40–1.50)
GFR: 87 mL/min (ref >60.00)
Glucose, Bld: 128 mg/dL — ABNORMAL HIGH (ref 70–99)
Potassium: 4.7 mEq/L (ref 3.5–5.1)
SODIUM: 136 meq/L (ref 135–145)
Total Bilirubin: 0.5 mg/dL (ref 0.2–1.2)
Total Protein: 6.1 g/dL (ref 6.0–8.3)

## 2015-01-18 LAB — MICROALBUMIN / CREATININE URINE RATIO
Creatinine,U: 49.1 mg/dL
Microalb Creat Ratio: 1.4 mg/g (ref 0.0–30.0)
Microalb, Ur: <0.7 mg/dL (ref 0.0–1.9)

## 2015-01-18 LAB — PSA: PSA: 0.3 ng/mL (ref 0.10–4.00)

## 2015-01-18 LAB — HEMOGLOBIN A1C: Hgb A1c MFr Bld: 6.7 % — ABNORMAL HIGH (ref 4.6–6.5)

## 2015-01-22 ENCOUNTER — Encounter (INDEPENDENT_AMBULATORY_CARE_PROVIDER_SITE_OTHER): Payer: Self-pay

## 2015-01-22 ENCOUNTER — Encounter: Payer: Self-pay | Admitting: Family Medicine

## 2015-01-22 ENCOUNTER — Ambulatory Visit (INDEPENDENT_AMBULATORY_CARE_PROVIDER_SITE_OTHER): Payer: BLUE CROSS/BLUE SHIELD | Admitting: Family Medicine

## 2015-01-22 VITALS — BP 112/68 | HR 69 | Temp 97.9°F | Ht 67.0 in | Wt 174.8 lb

## 2015-01-22 DIAGNOSIS — E785 Hyperlipidemia, unspecified: Secondary | ICD-10-CM

## 2015-01-22 DIAGNOSIS — Z87891 Personal history of nicotine dependence: Secondary | ICD-10-CM

## 2015-01-22 DIAGNOSIS — Z7189 Other specified counseling: Secondary | ICD-10-CM | POA: Insufficient documentation

## 2015-01-22 DIAGNOSIS — Z0000 Encounter for general adult medical examination without abnormal findings: Secondary | ICD-10-CM | POA: Diagnosis not present

## 2015-01-22 DIAGNOSIS — E119 Type 2 diabetes mellitus without complications: Secondary | ICD-10-CM

## 2015-01-22 MED ORDER — METFORMIN HCL 500 MG PO TABS: 500.0000 mg | ORAL_TABLET | Freq: Two times a day (BID) | ORAL | Status: DC

## 2015-01-22 NOTE — Assessment & Plan Note (Signed)
Statin intolerant, continue as is with diet and exercise.

## 2015-01-22 NOTE — Assessment & Plan Note (Signed)
Routine anticipatory guidance given to patient.  See health maintenance. Tetanus 2015 Shingles d/w pt.   PNA 2015 Flu shot prev done, d/w pt.  FH GBS, but not personal hx.  PSA 2016.   Colonoscopy done 07/17/10.  Living will d/w pt. Wife designated if he were incapacitated, then his daughter.   Diet and exercise d/w pt. "I was doing good until I quit smoking." Encouraged healthy diet and exercise.   Sister was dx'd with Ezequiel Ganser. She is in long term care facility currently. Discussed.  He quit smoking. Discussed. I encouraged him to stick with it.

## 2015-01-22 NOTE — Patient Instructions (Addendum)
Check with your insurance to see if they will cover the shingles shot. Call about an eye exam.  Take care.  Glad to see you.  Recheck in about 1 year.

## 2015-01-22 NOTE — Progress Notes (Signed)
Pre visit review using our clinic review tool, if applicable. No additional management support is needed unless otherwise documented below in the visit note.  CPE- See plan.  Routine anticipatory guidance given to patient.  See health maintenance. Tetanus 2015 Shingles d/w pt.   PNA 2015 Flu shot prev done, d/w pt.  FH GBS, but not personal hx.  PSA 2016.   Colonoscopy done 07/17/10.  Living will d/w pt. Wife designated if he were incapacitated, then his daughter.   Diet and exercise d/w pt. "I was doing good until I quit smoking." Encouraged healthy diet and exercise.   Sister was dx'd with Ezequiel Ganser. She is in long term care facility currently. Discussed.  He quit smoking. Discussed. I encouraged him to stick with it.     Diabetes:  Using medications without difficulties:yes Hypoglycemic episodes:rare, if prolonged fasting.   Hyperglycemic episodes:no Feet problems: some tingling, from his back, has improved with chiropractor tx.   Blood Sugars averaging: usually 110-140s in early AM.  Midday it is 90-110s.  Usually ~100 at the end of the day.   eye exam within last year: due, d/w pt.   A1c <7.    PMH and SH reviewed  Meds, vitals, and allergies reviewed.   ROS: See HPI.  Otherwise negative.    GEN: nad, alert and oriented HEENT: mucous membranes moist NECK: supple w/o LA CV: rrr. PULM: ctab, no inc wob ABD: soft, +bs EXT: no edema SKIN: no acute rash  Diabetic foot exam: Normal inspection No skin breakdown No calluses  Normal DP pulses Normal sensation to light touch and monofilament Nails normal

## 2015-01-22 NOTE — Assessment & Plan Note (Signed)
I encouraged patient to stick with cessation.

## 2015-01-22 NOTE — Assessment & Plan Note (Signed)
Controlled, A1c <7, continue as is with diet and exercise.  Some weight gain with smoking cessation but still doing well overall.  No change in meds.  He agrees.

## 2015-01-23 ENCOUNTER — Telehealth: Payer: Self-pay | Admitting: Family Medicine

## 2015-04-04 LAB — HM DIABETES EYE EXAM

## 2015-04-26 ENCOUNTER — Encounter: Payer: Self-pay | Admitting: Family Medicine

## 2015-06-28 ENCOUNTER — Other Ambulatory Visit: Payer: Self-pay | Admitting: Family Medicine

## 2015-10-23 ENCOUNTER — Other Ambulatory Visit: Payer: Self-pay | Admitting: Physician Assistant

## 2015-10-23 DIAGNOSIS — M47816 Spondylosis without myelopathy or radiculopathy, lumbar region: Secondary | ICD-10-CM

## 2015-11-08 ENCOUNTER — Ambulatory Visit
Admission: RE | Admit: 2015-11-08 | Discharge: 2015-11-08 | Disposition: A | Discharge status: Home / Self Care | Payer: BLUE CROSS/BLUE SHIELD | Source: Ambulatory Visit | Attending: Physician Assistant | Admitting: Physician Assistant

## 2015-11-08 DIAGNOSIS — M5127 Other intervertebral disc displacement, lumbosacral region: Secondary | ICD-10-CM | POA: Diagnosis not present

## 2015-11-08 DIAGNOSIS — M79604 Pain in right leg: Secondary | ICD-10-CM | POA: Diagnosis not present

## 2015-11-08 DIAGNOSIS — R200 Anesthesia of skin: Secondary | ICD-10-CM | POA: Insufficient documentation

## 2015-11-08 DIAGNOSIS — M5136 Other intervertebral disc degeneration, lumbar region: Secondary | ICD-10-CM | POA: Insufficient documentation

## 2015-11-08 DIAGNOSIS — M4806 Spinal stenosis, lumbar region: Secondary | ICD-10-CM | POA: Diagnosis not present

## 2015-11-08 DIAGNOSIS — M47816 Spondylosis without myelopathy or radiculopathy, lumbar region: Secondary | ICD-10-CM | POA: Diagnosis present

## 2016-01-24 ENCOUNTER — Encounter: Payer: BLUE CROSS/BLUE SHIELD | Admitting: Family Medicine

## 2016-12-13 ENCOUNTER — Encounter: Payer: Self-pay | Admitting: Emergency Medicine

## 2016-12-13 ENCOUNTER — Emergency Department
Admission: EM | Admit: 2016-12-13 | Discharge: 2016-12-13 | Disposition: A | Discharge status: Home / Self Care | Payer: BLUE CROSS/BLUE SHIELD | Attending: Emergency Medicine | Admitting: Emergency Medicine

## 2016-12-13 ENCOUNTER — Emergency Department: Payer: BLUE CROSS/BLUE SHIELD

## 2016-12-13 DIAGNOSIS — R109 Unspecified abdominal pain: Secondary | ICD-10-CM | POA: Diagnosis present

## 2016-12-13 DIAGNOSIS — N23 Unspecified renal colic: Secondary | ICD-10-CM | POA: Insufficient documentation

## 2016-12-13 DIAGNOSIS — Z87891 Personal history of nicotine dependence: Secondary | ICD-10-CM | POA: Insufficient documentation

## 2016-12-13 DIAGNOSIS — R52 Pain, unspecified: Secondary | ICD-10-CM

## 2016-12-13 DIAGNOSIS — E119 Type 2 diabetes mellitus without complications: Secondary | ICD-10-CM | POA: Diagnosis not present

## 2016-12-13 LAB — CBC WITH DIFFERENTIAL/PLATELET
BASOS PCT: 1 %
Basophils Absolute: 0.1 10*3/uL (ref 0–0.1)
EOS PCT: 1 %
Eosinophils Absolute: 0.1 10*3/uL (ref 0–0.7)
HEMATOCRIT: 44.6 % (ref 40.0–52.0)
Hemoglobin: 15.6 g/dL (ref 13.0–18.0)
LYMPHS PCT: 16 %
Lymphs Abs: 1.3 10*3/uL (ref 1.0–3.6)
MCH: 30.9 pg (ref 26.0–34.0)
MCHC: 35.1 g/dL (ref 32.0–36.0)
MCV: 88.3 fL (ref 80.0–100.0)
MONO ABS: 0.5 10*3/uL (ref 0.2–1.0)
MONOS PCT: 6 %
NEUTROS ABS: 6.4 10*3/uL (ref 1.4–6.5)
Neutrophils Relative %: 76 %
PLATELETS: 297 10*3/uL (ref 150–440)
RBC: 5.05 MIL/uL (ref 4.40–5.90)
RDW: 12.7 % (ref 11.5–14.5)
WBC: 8.5 10*3/uL (ref 3.8–10.6)

## 2016-12-13 LAB — BASIC METABOLIC PANEL
ANION GAP: 7 (ref 5–15)
BUN: 13 mg/dL (ref 6–20)
CALCIUM: 9.6 mg/dL (ref 8.9–10.3)
CO2: 27 mmol/L (ref 22–32)
Chloride: 104 mmol/L (ref 101–111)
Creatinine, Ser: 0.95 mg/dL (ref 0.61–1.24)
GFR calc Af Amer: >60 mL/min (ref >60)
GLUCOSE: 150 mg/dL — AB (ref 65–99)
Potassium: 4.1 mmol/L (ref 3.5–5.1)
Sodium: 138 mmol/L (ref 135–145)

## 2016-12-13 LAB — URINALYSIS, COMPLETE (UACMP) WITH MICROSCOPIC
BILIRUBIN URINE: NEGATIVE
Bacteria, UA: NONE SEEN
GLUCOSE, UA: NEGATIVE mg/dL
Ketones, ur: NEGATIVE mg/dL
Leukocytes, UA: NEGATIVE
NITRITE: NEGATIVE
Protein, ur: NEGATIVE mg/dL
SPECIFIC GRAVITY, URINE: 1.001 — AB (ref 1.005–1.030)
Squamous Epithelial / LPF: NONE SEEN
pH: 6 (ref 5.0–8.0)

## 2016-12-13 NOTE — ED Triage Notes (Signed)
Pt presents to the ER with complaints of right flank pain. Pt reports history of kidney stones, pt reports pain feels like a kidney stone. Pt reports right lower back pain and flank pain. Pt denies any n/v. Pt talks in complete sentences no distress noted

## 2016-12-13 NOTE — Discharge Instructions (Signed)
Please call the urologist Monday morning. He should be able to follow-up with you fairly soon. Strain your urine. If you catch stone please take it to him. He can take your other stones to him as well. He may want to have them analyzed to see if there is a medicine we can give you to keep you from having so many. Please return here for worse pain fever vomiting or feeling sicker. If the pain gets worse you can take some hydrocodone. Take 1-2 pills 4 times a day. Be carefull they can make you woozy, don't drive on them. They can also make you constipated.

## 2016-12-13 NOTE — ED Provider Notes (Signed)
[facility] Emergency Department Provider Note   ____________________________________________   First MD Initiated Contact with Patient 12/13/16 1502     (approximate)  I have reviewed the triage vital signs and the nursing notes.   HISTORY  Chief Complaint Flank Pain    HPI Gary Tavella. is a 61 y.o. male who has had multiple kidney stones in the past. He is having a pain now that comes from near his spine on the right side at the level of his belt runs around into the front of his abdomen occasionally down to the groin and sometimes higher up into the belly. This comes and goes it is not severe but it is worrisome to him and annoying. He thinks this could be a kidney stone but his words potentially be something else. He says for the last few months whenever he eats he has diarrhea   Past Medical History:  Diagnosis Date  . Colitis 2014  . Diabetes mellitus, type 2 ([place]) 08/22/04  . 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 Hyperlipemia 05/23/04   statin intolerant  . Inguinal hernia   . Kidney stones   . OA (osteoarthritis)    of hands    Patient Active Problem List   Diagnosis Date Noted  . Advance care planning 01/22/2015  . Routine general medical examination at a health care facility 11/04/2013  . Colitis 06/05/2013  . Bilateral inguinal hernia (BIH) L>R 12/07/2012  . Dupuytren contracture 12/07/2012  . Administrative encounter 09/14/2012  . FH: prostate cancer 12/04/2011  . Diverticulosis 01/08/2011  . 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 HEMORRHOIDS, WITH BLEEDING 07/03/2010  . Diabetes mellitus without complication ([place]) 60/06/9322  . HLD (hyperlipidemia) 05/23/2004  . OSTEOARTHROSIS NOS, UNSPECIFIED SITE 01/20/2001  . Former smoker 09/22/1970    Past Surgical History:  Procedure Laterality Date  . CERVICAL FUSION  1998   4/5/6 Dr. Carloyn Manner  .  FOOT SURGERY  2009  . KIDNEY STONE SURGERY  1979-present  . SEPTOPLASTY  [place]    Prior to Admission medications   Medication Sig Start Date End Date Taking? Authorizing Provider  metFORMIN (GLUCOPHAGE) 500 MG tablet Take 1 tablet (500 mg total) by mouth 2 (two) times daily. 01/22/15  Yes Tonia Ghent, MD  Garlic (ODOR FREE GARLIC) 557 MG TABS Take by mouth daily.      Historical Provider, MD  glucose blood (ONE TOUCH ULTRA TEST) test strip CHECK BLOOD SUGAR ONCE DAILY AND AS NEEDED (dx. DM E11.9) 06/28/15   Abner Greenspan, MD  ONETOUCH DELICA LANCETS 32K MISC CHECK BLOOD SUGAR ONCE DAILY AND AS NEEDED (dx. DM E11.9) 06/28/15   Abner Greenspan, MD    Allergies Contrast media [iodinated diagnostic agents]; Pravastatin; and Simvastatin  Family History  Problem Relation Age of Onset  . Diabetes Mother   . Kidney disease Mother     Renal Insuff  . Heart disease Mother     CAD  . COPD Mother   . Peripheral vascular disease Mother   . Hypertension Brother   . Lupus Sister   . COPD Sister   . Mental illness Sister     Bipolar  . Kidney Stones Father   . Prostate cancer Paternal Uncle   . Colon cancer Neg Hx     Social History Social History  Substance Use Topics  . Smoking status: Former  Smoker    Packs/day: 0.50    Years: 40.00    Types: Cigarettes  . Smokeless tobacco: Never Used  . Alcohol use No     Comment: essentially none, very rarely    Review of Systems Constitutional: No fever/chills Eyes: No visual changes. ENT: No sore throat. Cardiovascular: Denies chest pain. Respiratory: Denies shortness of breath. Gastrointestinal: See history of present illness Genitourinary: Negative for dysuria. Musculoskeletal: See history of present illness Skin: Negative for rash. Neurological: Negative for headaches, focal weakness or numbness.  10-point ROS otherwise negative.  ____________________________________________   PHYSICAL EXAM:  VITAL SIGNS: ED  Triage Vitals  Enc Vitals Group     BP 12/13/16 1442 (!) 145/95     Pulse Rate 12/13/16 1442 72     Resp 12/13/16 1442 20     Temp 12/13/16 1442 97.9 F (36.6 C)     Temp Source 12/13/16 1442 Oral     SpO2 12/13/16 1442 97 %     Weight 12/13/16 1444 181 lb (82.1 kg)     Height 12/13/16 1444 5\' 6"  (1.676 m)     Head Circumference --      Peak Flow --      Pain Score 12/13/16 1445 5     Pain Loc --      Pain Edu? --      Excl. in [place]? --     Constitutional: Alert and oriented. Well appearing and in no acute distress. Eyes: Conjunctivae are normal. PERRL. EOMI. Head: Atraumatic. Nose: No congestion/rhinnorhea. Mouth/Throat: Mucous membranes are moist.  Oropharynx non-erythematous. Neck: No stridor. Cardiovascular: Normal rate, regular rhythm. Grossly normal heart sounds.  Good peripheral circulation. Respiratory: Normal respiratory effort.  No retractions. Lungs CTAB. Gastrointestinal: Soft and nontender. No distention. No abdominal bruits. No CVA tenderness. Musculoskeletal: No lower extremity tenderness nor edema.  No joint effusions.   ____________________________________________   LABS (all labs ordered are listed, but only abnormal results are displayed)  Labs Reviewed  URINALYSIS, COMPLETE (UACMP) WITH MICROSCOPIC - Abnormal; Notable for the following:       Result Value   Color, Urine COLORLESS (*)    APPearance CLEAR (*)    Specific Gravity, Urine 1.001 (*)    Hgb urine dipstick SMALL (*)    All other components within normal limits  BASIC METABOLIC PANEL - Abnormal; Notable for the following:    Glucose, Bld 150 (*)    All other components within normal limits  CBC WITH DIFFERENTIAL/PLATELET   ____________________________________________  EKG   ____________________________________________  RADIOLOGY  Study Result   CLINICAL DATA:  Patient with right flank pain. History of renal stones.  EXAM: CT ABDOMEN AND PELVIS WITHOUT  CONTRAST  TECHNIQUE: Multidetector CT imaging of the abdomen and pelvis was performed following the standard protocol without IV contrast.  COMPARISON:  CT abdomen pelvis 05/28/2013  FINDINGS: Lower chest: Normal heart size. Dependent atelectasis within the bilateral lower lobes. No pleural effusion.  Hepatobiliary: Liver is low in attenuation compatible with steatosis. Gallbladder is decompressed.  Pancreas: Unremarkable  Spleen: Unremarkable  Adrenals/Urinary Tract: 1.4 cm left adrenal adenoma (image 25; series 2). Right adrenal gland is normal. The right kidney is enlarged and edematous with surrounding fat stranding. There is moderate right hydroureteronephrosis to the distal right ureter were there is an obstructing 8 mm stone (100; series 5). Urinary bladder is unremarkable. Left kidney is unremarkable.  Stomach/Bowel: Stool throughout the colon. Normal appendix. No evidence for bowel obstruction. No free fluid or free  intraperitoneal air.  Vascular/Lymphatic: Normal caliber abdominal aorta. Peripheral calcified atherosclerotic plaque. No retroperitoneal lymphadenopathy.  Reproductive: Central dystrophic calcifications in the prostate.  Other: Bilateral fat containing inguinal hernias. Probable left varicocele.  Musculoskeletal: Lumbar spine degenerative changes. Lumbar spinal fusion hardware.  IMPRESSION: There is an obstructing 8 mm stone within the distal right ureter resulting in moderate right hydroureteronephrosis.  Aortic atherosclerosis.  Bilateral fat containing inguinal hernias.   Electronically Signed   By: Lovey Newcomer M.D.   On: 12/13/2016 16:27    ____________________________________________   PROCEDURES  Procedure(s) performed:   Procedures  Critical Care performed:   ____________________________________________   INITIAL IMPRESSION / ASSESSMENT AND PLAN / ED COURSE  Pertinent labs & imaging results that were  available during my care of the patient were reviewed by me and considered in my medical decision making (see chart for details).  Discussed with Dr.Budzyn, he will follow-up.      ____________________________________________   FINAL CLINICAL IMPRESSION(S) / ED DIAGNOSES  Final diagnoses:  Pain  Ureteral colic      NEW MEDICATIONS STARTED DURING THIS VISIT:  New Prescriptions   No medications on file     Note:  This document was prepared using Dragon voice recognition software and may include unintentional dictation errors.    Nena Polio, MD 12/13/16 [phone]

## 2016-12-13 NOTE — ED Notes (Signed)
Pt reports that he is having right flank pain - he states he has a history of kidney stones and that this pain feels the same

## 2016-12-16 ENCOUNTER — Ambulatory Visit: Payer: BLUE CROSS/BLUE SHIELD | Admitting: Urology

## 2016-12-16 ENCOUNTER — Encounter: Payer: Self-pay | Admitting: Urology

## 2016-12-16 VITALS — BP 121/82 | HR 94 | Ht 67.0 in | Wt 183.0 lb

## 2016-12-16 DIAGNOSIS — N200 Calculus of kidney: Secondary | ICD-10-CM | POA: Diagnosis not present

## 2016-12-16 NOTE — Progress Notes (Signed)
12/16/2016 5:06 PM   Gary Everett 1956-03-18 [id number]  Referring provider: Maryland Pink, MD [address] [facility] [place], [place] [address]  Chief Complaint  Patient presents with  . New Patient (Initial Visit)    nephrolithiasis    HPI: 61 year old male who presents today for follow-up from the emergency room where he was seen 4 days prior with a right distal ureteral stone. The patient has a long history of stones and has been treated by Dr. Ernst Spell 4 different times for kidney stones. He has had both shockwave lithotripsy as well as ureteroscopy. He also has passed many stones on his own.  The patient states that his pain began several days prior to his presentation to the emergency room. In the emergency room he had a normal white blood cell count and his creatinine was also normal.  His urinalysis demonstrated microscopic hematuria without clear evidence of infection. The CT scan showed a 8 mm x 4 mm right distal ureteral stone just proximal to the UVJ. His pain was well-controlled in the emergency room and he was discharged with recommendation of follow-up in the urology clinic.  Since the patient was discharged she is not had any significant pain. He continues to have some slight pressure. He has not passed the stone, that he knows of.   PMH: Past Medical History:  Diagnosis Date  . Colitis 2014  . Diabetes mellitus, type 2 ([place]) 08/22/04  . 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 Hyperlipemia 05/23/04   statin intolerant  . Inguinal hernia   . Kidney stones   . OA (osteoarthritis)    of hands    Surgical History: Past Surgical History:  Procedure Laterality Date  . BACK SURGERY    . CERVICAL FUSION  1998   4/5/6 Dr. Carloyn Manner  . FOOT SURGERY  2009  . KIDNEY STONE SURGERY  1979-present  . NECK SURGERY    . SEPTOPLASTY  1999   [facility] Medications:  Allergies as of  12/16/2016      Reactions   Contrast Media [iodinated Diagnostic Agents]    rash   Pravastatin    myalgias   Simvastatin    Other reaction(s): Other (See Comments) myalgias myalgias      Medication List       Accurate as of 12/16/16  5:06 PM. Always use your most recent med list.          glucose blood test strip Commonly known as:  ONE TOUCH ULTRA TEST CHECK BLOOD SUGAR ONCE DAILY AND AS NEEDED (dx. DM E11.9)   metFORMIN 500 MG tablet Commonly known as:  GLUCOPHAGE Take by mouth.   ODOR FREE GARLIC 998 MG Tabs Generic drug:  Garlic Take by mouth daily.   [place] LANCETS 33A Misc CHECK BLOOD SUGAR ONCE DAILY AND AS NEEDED (dx. DM E11.9)       Allergies:  Allergies  Allergen Reactions  . Contrast Media [Iodinated Diagnostic Agents]     rash  . Pravastatin     myalgias  . Simvastatin     Other reaction(s): Other (See Comments) myalgias myalgias    Family History: Family History  Problem Relation Age of Onset  . Diabetes Mother   . Kidney disease Mother     Renal Insuff  . Heart disease Mother     CAD  . COPD Mother   .  Peripheral vascular disease Mother   . Hypertension Brother   . Lupus Sister   . COPD Sister   . Mental illness Sister     Bipolar  . Kidney Stones Father   . Prostate cancer Paternal Uncle   . Colon cancer Neg Hx   . Hematuria Neg Hx   . Renal cancer Neg Hx   . Sickle cell anemia Neg Hx     Social History:  reports that he has quit smoking. His smoking use included Cigarettes. He has a 20.00 pack-year smoking history. He has never used smokeless tobacco. He reports that he does not drink alcohol or use drugs.  ROS:                                        Physical Exam: BP 121/82   Pulse 94   Ht 5\' 7"  (1.702 m)   Wt 83 kg (183 lb)   BMI 28.66 kg/m   Constitutional:  Alert and oriented, No acute distress. HEENT: House AT, moist mucus membranes.  Trachea midline, no masses. Cardiovascular: No  clubbing, cyanosis, or edema. Respiratory: Normal respiratory effort, no increased work of breathing. GI: Abdomen is soft, nontender, nondistended, no abdominal masses GU: No CVA tenderness. Skin: No rashes, bruises or picious lesions. Lymph: No cervical or inguinal adenopathy. Neurologic: Grossly intact, no focal deficits, moving all 4 extremities. Psychiatric: Normal mood and affect.  Laboratory Data: Lab Results  Component Value Date   WBC 8.5 12/13/2016   HGB 15.6 12/13/2016   HCT 44.6 12/13/2016   MCV 88.3 12/13/2016   PLT 297 12/13/2016    Lab Results  Component Value Date   CREATININE 0.95 12/13/2016    Lab Results  Component Value Date   PSA 0.30 01/18/2015   PSA 0.26 11/04/2013   PSA 0.40 05/25/2012    No results found for: TESTOSTERONE  Lab Results  Component Value Date   HGBA1C 6.7 (H) 01/18/2015    Urinalysis    Component Value Date/Time   COLORURINE COLORLESS (A) 12/13/2016 1454   APPEARANCEUR CLEAR (A) 12/13/2016 1454   APPEARANCEUR Clear 05/28/2013 1110   LABSPEC 1.001 (L) 12/13/2016 1454   LABSPEC 1.004 05/28/2013 1110   PHURINE 6.0 12/13/2016 1454   GLUCOSEU NEGATIVE 12/13/2016 1454   GLUCOSEU Negative 05/28/2013 1110   HGBUR SMALL (A) 12/13/2016 1454   BILIRUBINUR NEGATIVE 12/13/2016 1454   BILIRUBINUR Negative 05/28/2013 1110   KETONESUR NEGATIVE 12/13/2016 1454   PROTEINUR NEGATIVE 12/13/2016 1454   UROBILINOGEN 0.2 09/14/2012 0843   NITRITE NEGATIVE 12/13/2016 1454   LEUKOCYTESUR NEGATIVE 12/13/2016 1454   LEUKOCYTESUR Trace 05/28/2013 1110    Pertinent Imaging: I have independently reviewed the patient's CT scan from 12/13/16 demonstrating an Ford times 8 mm right distal ureteral stone with associated proximal hydronephrosis. There were no additional stones within either kidney.  Assessment & Plan:  The patient has a right distal ureteral stone which currently is asymptomatic. He would like to get this treated soon as possible so  that he can go on a vacation that he has planned.  1. Nephrolithiasis I discussed the treatment options with the patient including medical expulsion therapy, shockwave lithotripsy, and ureteroscopy. The patient has had both shockwave lithotripsy and ureteroscopy and prefers to proceed with shockwave lithotripsy. We discussed the procedure in detail including the risks and the benefits. The patient understands that the stone may not be  targetable and if just the case it would be canceled. He would then either need to do ureteroscopy or not a call expulsion therapy. He also understands the risk of needing a second procedure to clear stone fragments. Having gone over all the risks and the benefits of the procedure, the patient would like to proceed. - Urinalysis, Complete - CULTURE, URINE COMPREHENSIVE   No Follow-up on file.  Ardis Hughs, [facility] [address] [place], [place] [address] [phone]

## 2016-12-17 LAB — URINALYSIS, COMPLETE
BILIRUBIN UA: NEGATIVE
GLUCOSE, UA: NEGATIVE
Ketones, UA: NEGATIVE
LEUKOCYTES UA: NEGATIVE
Nitrite, UA: NEGATIVE
PROTEIN UA: NEGATIVE
SPEC GRAV UA: 1.02 (ref 1.005–1.030)
Urobilinogen, Ur: 0.2 mg/dL (ref 0.2–1.0)
pH, UA: 5.5 (ref 5.0–7.5)

## 2016-12-18 ENCOUNTER — Encounter: Payer: Self-pay | Admitting: *Deleted

## 2016-12-18 ENCOUNTER — Ambulatory Visit: Payer: BLUE CROSS/BLUE SHIELD

## 2016-12-18 ENCOUNTER — Encounter
Admission: RE | Disposition: A | Discharge status: Home / Self Care | Payer: Self-pay | Source: Ambulatory Visit | Attending: Urology

## 2016-12-18 ENCOUNTER — Ambulatory Visit
Admission: RE | Admit: 2016-12-18 | Discharge: 2016-12-18 | Disposition: A | Discharge status: Home / Self Care | Payer: BLUE CROSS/BLUE SHIELD | Source: Ambulatory Visit | Attending: Urology | Admitting: Urology

## 2016-12-18 DIAGNOSIS — Z79899 Other long term (current) drug therapy: Secondary | ICD-10-CM | POA: Insufficient documentation

## 2016-12-18 DIAGNOSIS — Z87442 Personal history of urinary calculi: Secondary | ICD-10-CM | POA: Insufficient documentation

## 2016-12-18 DIAGNOSIS — Z87891 Personal history of nicotine dependence: Secondary | ICD-10-CM | POA: Insufficient documentation

## 2016-12-18 DIAGNOSIS — E785 Hyperlipidemia, unspecified: Secondary | ICD-10-CM | POA: Diagnosis not present

## 2016-12-18 DIAGNOSIS — E119 Type 2 diabetes mellitus without complications: Secondary | ICD-10-CM | POA: Insufficient documentation

## 2016-12-18 DIAGNOSIS — Z7984 Long term (current) use of oral hypoglycemic drugs: Secondary | ICD-10-CM | POA: Insufficient documentation

## 2016-12-18 DIAGNOSIS — N200 Calculus of kidney: Secondary | ICD-10-CM | POA: Diagnosis present

## 2016-12-18 DIAGNOSIS — M1990 Unspecified osteoarthritis, unspecified site: Secondary | ICD-10-CM | POA: Diagnosis not present

## 2016-12-18 HISTORY — PX: EXTRACORPOREAL SHOCK WAVE LITHOTRIPSY: SHX1557

## 2016-12-18 LAB — GLUCOSE, CAPILLARY: Glucose-Capillary: 156 mg/dL — ABNORMAL HIGH (ref 65–99)

## 2016-12-18 SURGERY — LITHOTRIPSY, ESWL
Anesthesia: Moderate Sedation | Laterality: Right

## 2016-12-18 MED ORDER — DIPHENHYDRAMINE HCL 25 MG PO CAPS
ORAL_CAPSULE | ORAL | Status: AC
  Filled 2016-12-18: qty 1

## 2016-12-18 MED ORDER — DIPHENHYDRAMINE HCL 25 MG PO CAPS
25.0000 mg | ORAL_CAPSULE | Freq: Once | ORAL | Status: AC
  Administered 2016-12-18: 25 mg via ORAL

## 2016-12-18 MED ORDER — CIPROFLOXACIN HCL 500 MG PO TABS
ORAL_TABLET | ORAL | Status: AC
  Filled 2016-12-18: qty 1

## 2016-12-18 MED ORDER — CIPROFLOXACIN HCL 500 MG PO TABS
500.0000 mg | ORAL_TABLET | Freq: Once | ORAL | Status: AC
  Administered 2016-12-18: 500 mg via ORAL

## 2016-12-18 MED ORDER — DEXTROSE-NACL 5-0.45 % IV SOLN
INTRAVENOUS | Status: DC
  Administered 2016-12-18: 09:00:00 via INTRAVENOUS

## 2016-12-18 MED ORDER — HYDROCODONE-ACETAMINOPHEN 5-325 MG PO TABS: 1.0000 | ORAL_TABLET | Freq: Four times a day (QID) | ORAL | 0 refills | Status: DC | PRN

## 2016-12-18 MED ORDER — DIAZEPAM 5 MG PO TABS
ORAL_TABLET | ORAL | Status: AC
  Filled 2016-12-18: qty 2

## 2016-12-18 MED ORDER — TAMSULOSIN HCL 0.4 MG PO CAPS: 0.4000 mg | ORAL_CAPSULE | Freq: Every day | ORAL | 0 refills | Status: DC

## 2016-12-18 MED ORDER — DIAZEPAM 5 MG PO TABS
10.0000 mg | ORAL_TABLET | Freq: Once | ORAL | Status: AC
  Administered 2016-12-18: 10 mg via ORAL

## 2016-12-18 NOTE — Interval H&P Note (Signed)
History and Physical Interval Note:  12/18/2016 10:14 AM  Gary Everett.  has presented today for surgery, with the diagnosis of Kidney Stone  The various methods of treatment have been discussed with the patient and family. After consideration of risks, benefits and other options for treatment, the patient has consented to  Procedure(s): EXTRACORPOREAL SHOCK WAVE LITHOTRIPSY (ESWL) (Right) as a surgical intervention .  The patient's history has been reviewed, patient examined, no change in status, stable for surgery.  I have reviewed the patient's chart and labs.  Questions were answered to the patient's satisfaction.    RRR CTAB  Hollice Espy

## 2016-12-18 NOTE — H&P (View-Only) (Signed)
12/16/2016 5:06 PM   Gary Everett 02/20/1956 [id number]  Referring provider: Maryland Pink, MD [address] [facility] [place], [place] [address]  Chief Complaint  Patient presents with  . New Patient (Initial Visit)    nephrolithiasis    HPI: 61 year old male who presents today for follow-up from the emergency room where he was seen 4 days prior with a right distal ureteral stone. The patient has a long history of stones and has been treated by Dr. Ernst Spell 4 different times for kidney stones. He has had both shockwave lithotripsy as well as ureteroscopy. He also has passed many stones on his own.  The patient states that his pain began several days prior to his presentation to the emergency room. In the emergency room he had a normal white blood cell count and his creatinine was also normal.  His urinalysis demonstrated microscopic hematuria without clear evidence of infection. The CT scan showed a 8 mm x 4 mm right distal ureteral stone just proximal to the UVJ. His pain was well-controlled in the emergency room and he was discharged with recommendation of follow-up in the urology clinic.  Since the patient was discharged she is not had any significant pain. He continues to have some slight pressure. He has not passed the stone, that he knows of.   PMH: Past Medical History:  Diagnosis Date  . Colitis 2014  . Diabetes mellitus, type 2 ([place]) 08/22/04  . 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 Hyperlipemia 05/23/04   statin intolerant  . Inguinal hernia   . Kidney stones   . OA (osteoarthritis)    of hands    Surgical History: Past Surgical History:  Procedure Laterality Date  . BACK SURGERY    . CERVICAL FUSION  1998   4/5/6 Dr. Carloyn Manner  . FOOT SURGERY  2009  . KIDNEY STONE SURGERY  1979-present  . NECK SURGERY    . SEPTOPLASTY  1999   [facility] Medications:  Allergies as of  12/16/2016      Reactions   Contrast Media [iodinated Diagnostic Agents]    rash   Pravastatin    myalgias   Simvastatin    Other reaction(s): Other (See Comments) myalgias myalgias      Medication List       Accurate as of 12/16/16  5:06 PM. Always use your most recent med list.          glucose blood test strip Commonly known as:  ONE TOUCH ULTRA TEST CHECK BLOOD SUGAR ONCE DAILY AND AS NEEDED (dx. DM E11.9)   metFORMIN 500 MG tablet Commonly known as:  GLUCOPHAGE Take by mouth.   ODOR FREE GARLIC 970 MG Tabs Generic drug:  Garlic Take by mouth daily.   Puryear LANCETS 26V Misc CHECK BLOOD SUGAR ONCE DAILY AND AS NEEDED (dx. DM E11.9)       Allergies:  Allergies  Allergen Reactions  . Contrast Media [Iodinated Diagnostic Agents]     rash  . Pravastatin     myalgias  . Simvastatin     Other reaction(s): Other (See Comments) myalgias myalgias    Family History: Family History  Problem Relation Age of Onset  . Diabetes Mother   . Kidney disease Mother     Renal Insuff  . Heart disease Mother     CAD  . COPD Mother   .  Peripheral vascular disease Mother   . Hypertension Brother   . Lupus Sister   . COPD Sister   . Mental illness Sister     Bipolar  . Kidney Stones Father   . Prostate cancer Paternal Uncle   . Colon cancer Neg Hx   . Hematuria Neg Hx   . Renal cancer Neg Hx   . Sickle cell anemia Neg Hx     Social History:  reports that he has quit smoking. His smoking use included Cigarettes. He has a 20.00 pack-year smoking history. He has never used smokeless tobacco. He reports that he does not drink alcohol or use drugs.  ROS:                                        Physical Exam: BP 121/82   Pulse 94   Ht 5\' 7"  (1.702 m)   Wt 83 kg (183 lb)   BMI 28.66 kg/m   Constitutional:  Alert and oriented, No acute distress. HEENT: [place] AT, moist mucus membranes.  Trachea midline, no masses. Cardiovascular: No  clubbing, cyanosis, or edema. Respiratory: Normal respiratory effort, no increased work of breathing. GI: Abdomen is soft, nontender, nondistended, no abdominal masses GU: No CVA tenderness. Skin: No rashes, bruises or picious lesions. Lymph: No cervical or inguinal adenopathy. Neurologic: Grossly intact, no focal deficits, moving all 4 extremities. Psychiatric: Normal mood and affect.  Laboratory Data: Lab Results  Component Value Date   WBC 8.5 12/13/2016   HGB 15.6 12/13/2016   HCT 44.6 12/13/2016   MCV 88.3 12/13/2016   PLT 297 12/13/2016    Lab Results  Component Value Date   CREATININE 0.95 12/13/2016    Lab Results  Component Value Date   PSA 0.30 01/18/2015   PSA 0.26 11/04/2013   PSA 0.40 05/25/2012    No results found for: TESTOSTERONE  Lab Results  Component Value Date   HGBA1C 6.7 (H) 01/18/2015    Urinalysis    Component Value Date/Time   COLORURINE COLORLESS (A) 12/13/2016 1454   APPEARANCEUR CLEAR (A) 12/13/2016 1454   APPEARANCEUR Clear 05/28/2013 1110   LABSPEC 1.001 (L) 12/13/2016 1454   LABSPEC 1.004 05/28/2013 1110   PHURINE 6.0 12/13/2016 1454   GLUCOSEU NEGATIVE 12/13/2016 1454   GLUCOSEU Negative 05/28/2013 1110   HGBUR SMALL (A) 12/13/2016 1454   BILIRUBINUR NEGATIVE 12/13/2016 1454   BILIRUBINUR Negative 05/28/2013 1110   KETONESUR NEGATIVE 12/13/2016 1454   PROTEINUR NEGATIVE 12/13/2016 1454   UROBILINOGEN 0.2 09/14/2012 0843   NITRITE NEGATIVE 12/13/2016 1454   LEUKOCYTESUR NEGATIVE 12/13/2016 1454   LEUKOCYTESUR Trace 05/28/2013 1110    Pertinent Imaging: I have independently reviewed the patient's CT scan from 12/13/16 demonstrating an Ford times 8 mm right distal ureteral stone with associated proximal hydronephrosis. There were no additional stones within either kidney.  Assessment & Plan:  The patient has a right distal ureteral stone which currently is asymptomatic. He would like to get this treated soon as possible so  that he can go on a vacation that he has planned.  1. Nephrolithiasis I discussed the treatment options with the patient including medical expulsion therapy, shockwave lithotripsy, and ureteroscopy. The patient has had both shockwave lithotripsy and ureteroscopy and prefers to proceed with shockwave lithotripsy. We discussed the procedure in detail including the risks and the benefits. The patient understands that the stone may not be  targetable and if just the case it would be canceled. He would then either need to do ureteroscopy or not a call expulsion therapy. He also understands the risk of needing a second procedure to clear stone fragments. Having gone over all the risks and the benefits of the procedure, the patient would like to proceed. - Urinalysis, Complete - CULTURE, URINE COMPREHENSIVE   No Follow-up on file.  Ardis Hughs, [facility] [address] [place], [place] [address] [phone]

## 2016-12-18 NOTE — Discharge Instructions (Signed)
AMBULATORY SURGERY  DISCHARGE INSTRUCTIONS   1) The drugs that you were given will stay in your system until tomorrow so for the next 24 hours you should not:  A) Drive an automobile B) Make any legal decisions C) Drink any alcoholic beverage   2) You may resume regular meals tomorrow.  Today it is better to start with liquids and gradually work up to solid foods.  You may eat anything you prefer, but it is better to start with liquids, then soup and crackers, and gradually work up to solid foods.   3) Please notify your doctor immediately if you have any unusual bleeding, trouble breathing, redness and pain at the surgery site, drainage, fever, or pain not relieved by medication.    4) Additional Instructions:   Please contact your physician with any problems or Same Day Surgery at [phone], Monday through Friday 6 am to 4 pm, or [employer] at [place] Main number at [phone].See [facility] discharge instructions in chart.  

## 2016-12-18 NOTE — Interval H&P Note (Signed)
History and Physical Interval Note:  12/18/2016 10:41 AM  Gary Everett.  has presented today for surgery, with the diagnosis of Kidney Stone  The various methods of treatment have been discussed with the patient and family. After consideration of risks, benefits and other options for treatment, the patient has consented to  Procedure(s): EXTRACORPOREAL SHOCK WAVE LITHOTRIPSY (ESWL) (Right) as a surgical intervention .  The patient's history has been reviewed, patient examined, no change in status, stable for surgery.  I have reviewed the patient's chart and labs.  Questions were answered to the patient's satisfaction.    RRR CTAB   Hollice Espy

## 2016-12-19 ENCOUNTER — Encounter: Payer: Self-pay | Admitting: Urology

## 2016-12-20 LAB — CULTURE, URINE COMPREHENSIVE

## 2017-01-07 ENCOUNTER — Ambulatory Visit: Payer: BLUE CROSS/BLUE SHIELD | Admitting: Urology

## 2017-01-15 ENCOUNTER — Telehealth: Payer: Self-pay | Admitting: Urology

## 2017-01-15 NOTE — Telephone Encounter (Signed)
Patient called today and said he wanted to cancel his follow up appt. He did nit say why but he did not want to reschd it. It was a follow up from surgery. I just wanted you to be aware that he has cx it.  Thanks,  Sharyn Lull

## 2017-01-19 NOTE — Telephone Encounter (Signed)
Called patient today to discuss reasons for not following up. He states that he has enough medical bills and doesn't want have additional costs for follow-up.  We discussed the rationale for having follow-up imaging including ensuring that he has passed all fragments. We discussed the consequences of retained ureteral fragments and consequences of unrecognized chronic obstruction including sepsis and renal compromise. He understands all of this.  He agreed today to go get his x-ray at some point "when he has time" and we will call him with these results at minimum.  Hollice Espy, MD

## 2017-01-20 ENCOUNTER — Ambulatory Visit: Payer: BLUE CROSS/BLUE SHIELD | Admitting: Urology

## 2017-02-10 ENCOUNTER — Other Ambulatory Visit: Payer: Self-pay

## 2017-02-10 DIAGNOSIS — N200 Calculus of kidney: Secondary | ICD-10-CM

## 2017-02-10 MED ORDER — TAMSULOSIN HCL 0.4 MG PO CAPS: 0.4000 mg | ORAL_CAPSULE | Freq: Every day | ORAL | 3 refills | Status: DC

## 2018-11-09 ENCOUNTER — Other Ambulatory Visit: Payer: Self-pay | Admitting: Neurosurgery

## 2018-11-09 DIAGNOSIS — M47816 Spondylosis without myelopathy or radiculopathy, lumbar region: Secondary | ICD-10-CM

## 2018-11-16 ENCOUNTER — Ambulatory Visit: Admission: RE | Admit: 2018-11-16 | Payer: BLUE CROSS/BLUE SHIELD | Source: Ambulatory Visit

## 2018-11-17 ENCOUNTER — Other Ambulatory Visit: Payer: Self-pay | Admitting: Neurosurgery

## 2018-11-17 ENCOUNTER — Ambulatory Visit
Admission: RE | Admit: 2018-11-17 | Discharge: 2018-11-17 | Disposition: A | Discharge status: Home / Self Care | Payer: BLUE CROSS/BLUE SHIELD | Source: Ambulatory Visit | Attending: Neurosurgery | Admitting: Neurosurgery

## 2018-11-17 DIAGNOSIS — M47816 Spondylosis without myelopathy or radiculopathy, lumbar region: Secondary | ICD-10-CM | POA: Insufficient documentation

## 2018-11-17 DIAGNOSIS — S46011A Strain of muscle(s) and tendon(s) of the rotator cuff of right shoulder, initial encounter: Secondary | ICD-10-CM

## 2020-02-14 IMAGING — CT CT L SPINE W/O CM
3 of 5 series · 9 of 33 positions shown, 10 images · non-contrast
Comparison: Radiography 12/18/2016.  CT 12/13/2016.

CLINICAL DATA: Back surgery 3 years ago. Low back pain with left
hip pain.

EXAM:
CT LUMBAR SPINE WITHOUT CONTRAST
TECHNIQUE: Multidetector CT imaging of the lumbar spine was performed without
intravenous contrast administration. Multiplanar CT image
reconstructions were also generated.

[Series 2: (person_name) · axial · 0.33mm/px · z∈[-1341,-1341]mm · 1 of 172 slices shown, 2 images]
[im 86/172  soft-tissue]
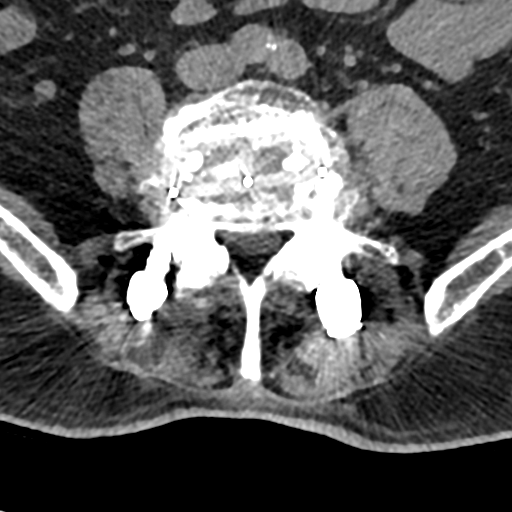
[im 86/172  bone]
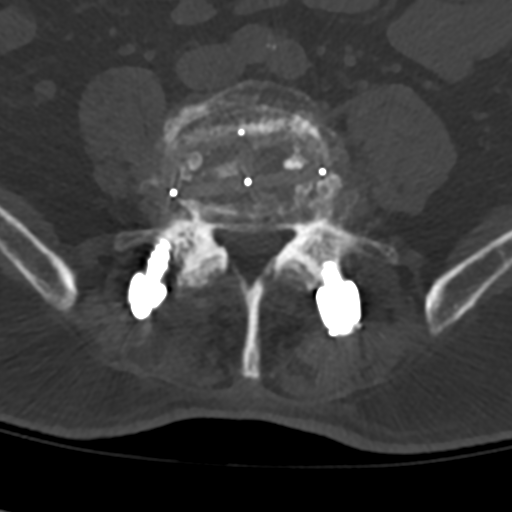

[Series 4: sag bone l-spine · sagittal · 0.31mm/px · 5 of 77 slices shown]
[im 26/77  bone]
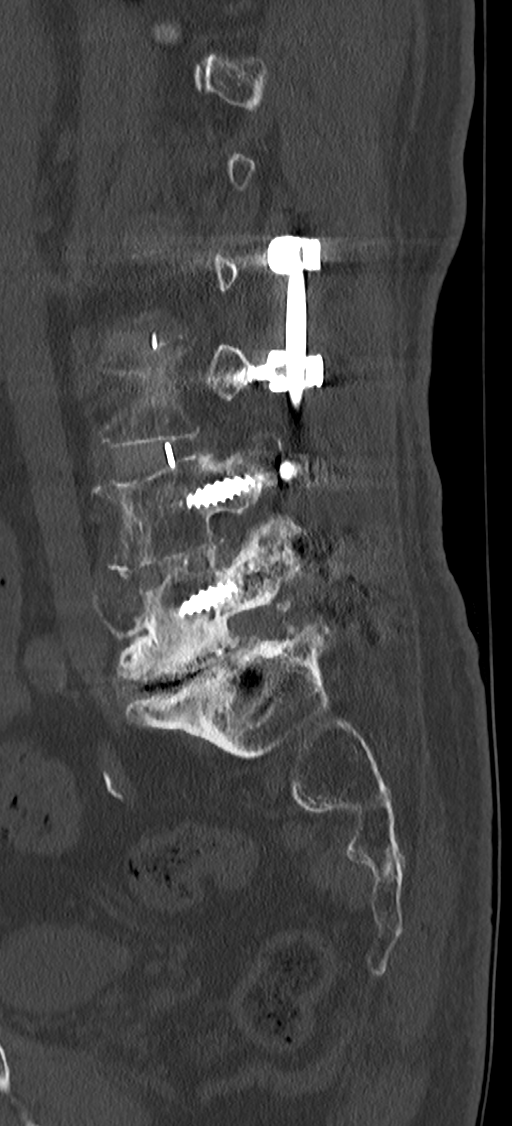
[im 32/77  bone]
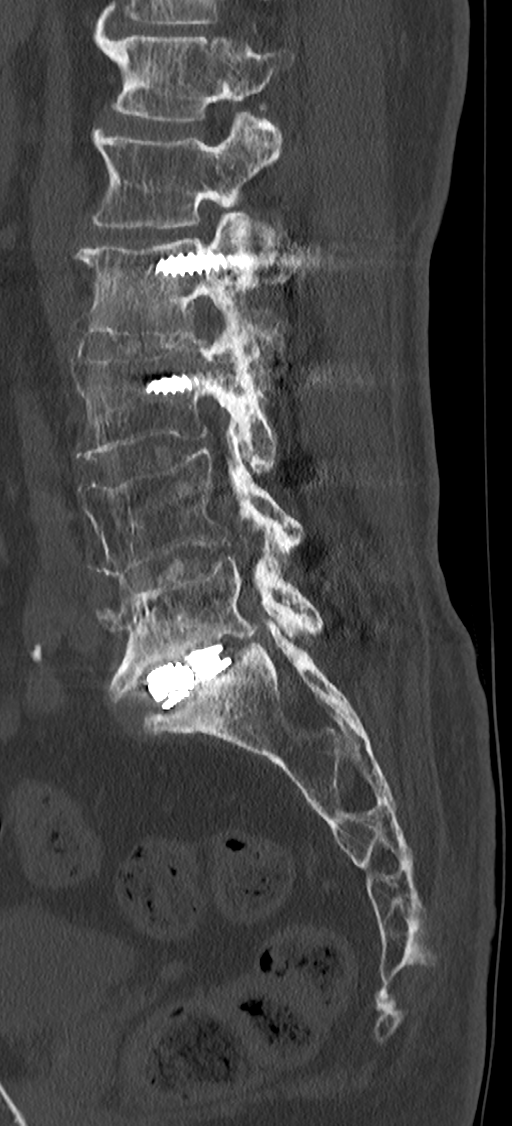
[im 39/77  bone]
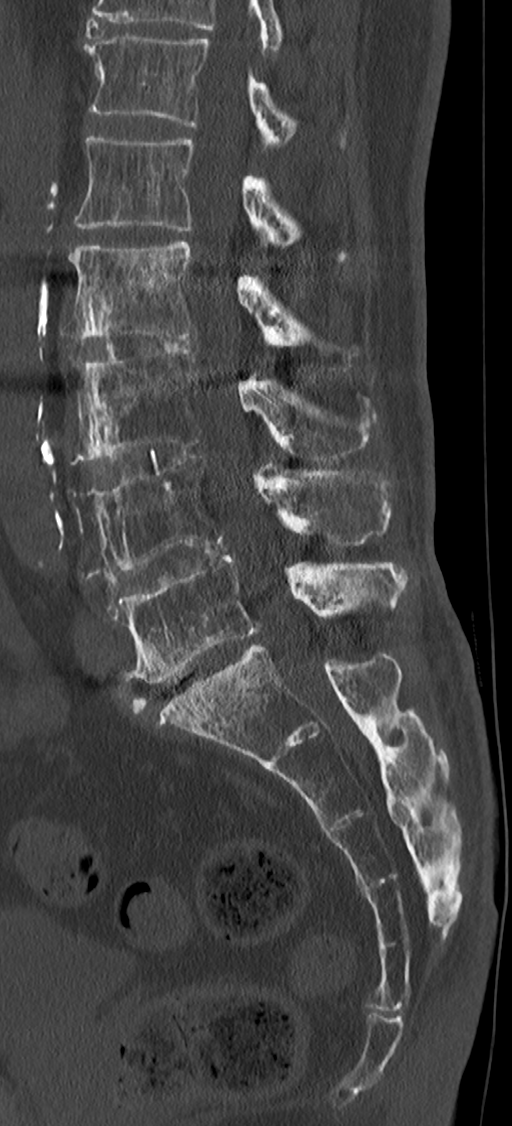
[im 45/77  bone]
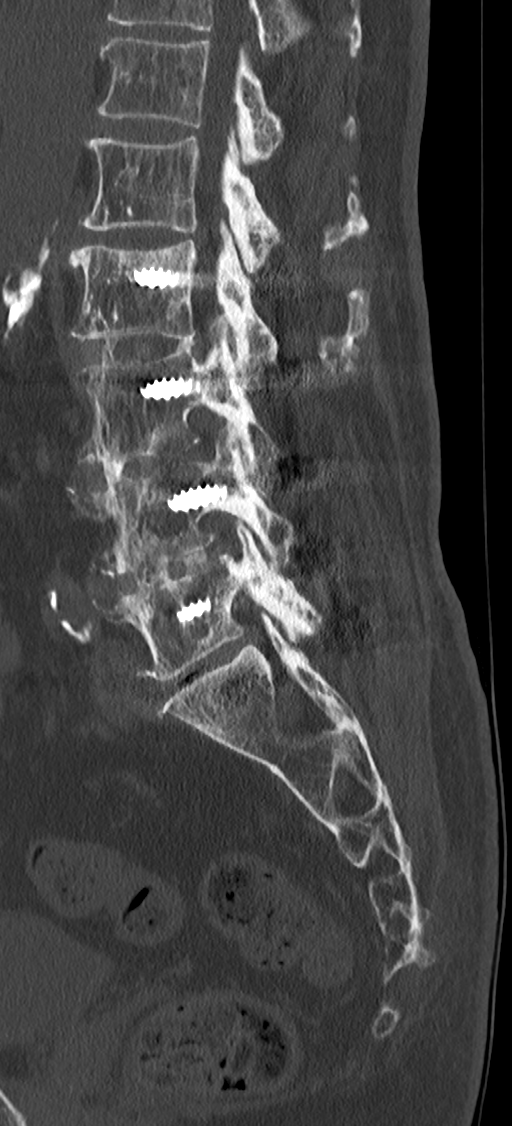
[im 51/77  bone]
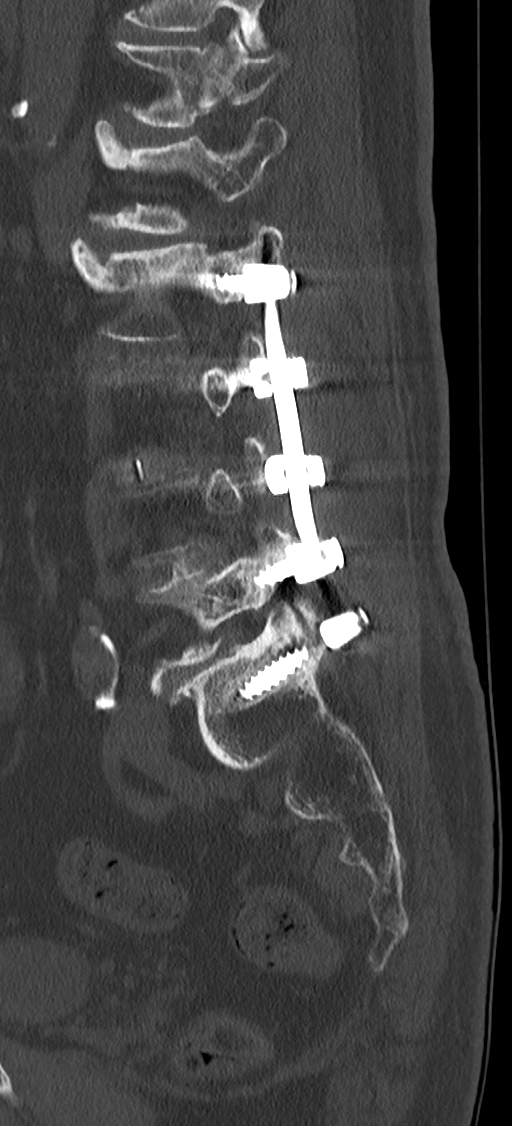

[Series 6: cor bone l-spine · coronal · 0.31mm/px · 3 of 76 slices shown]
[im 16/76  bone]
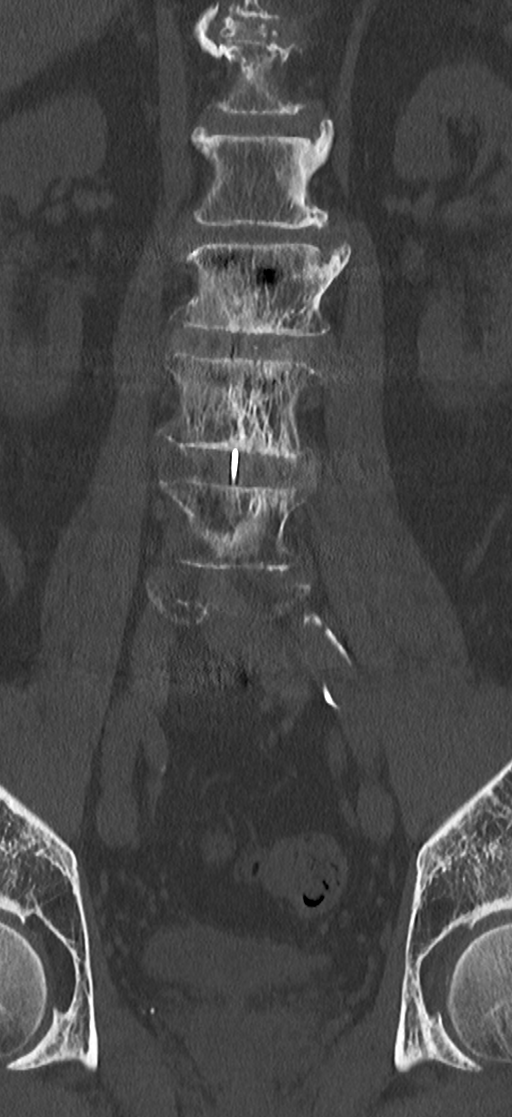
[im 31/76  bone]
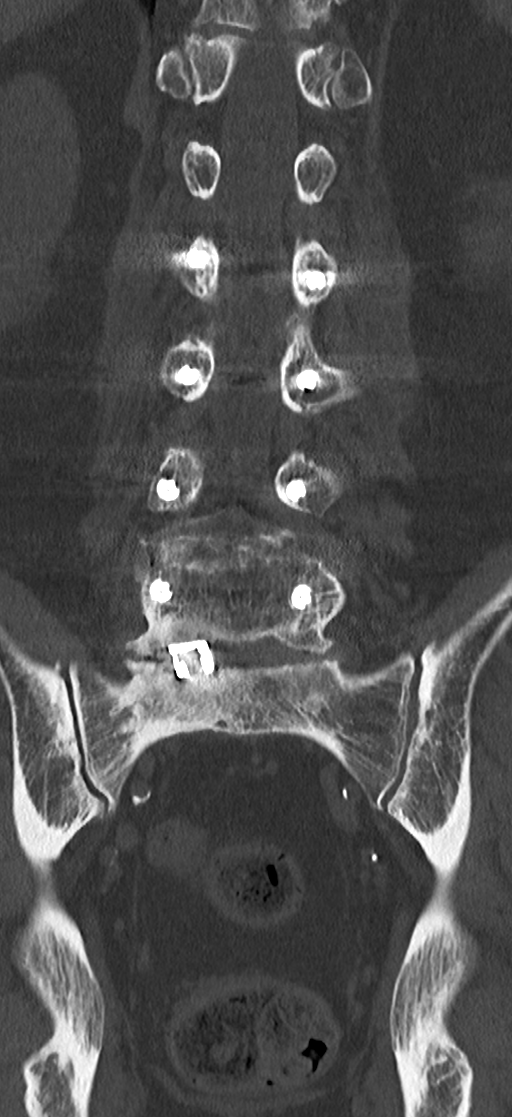
[im 46/76  bone]
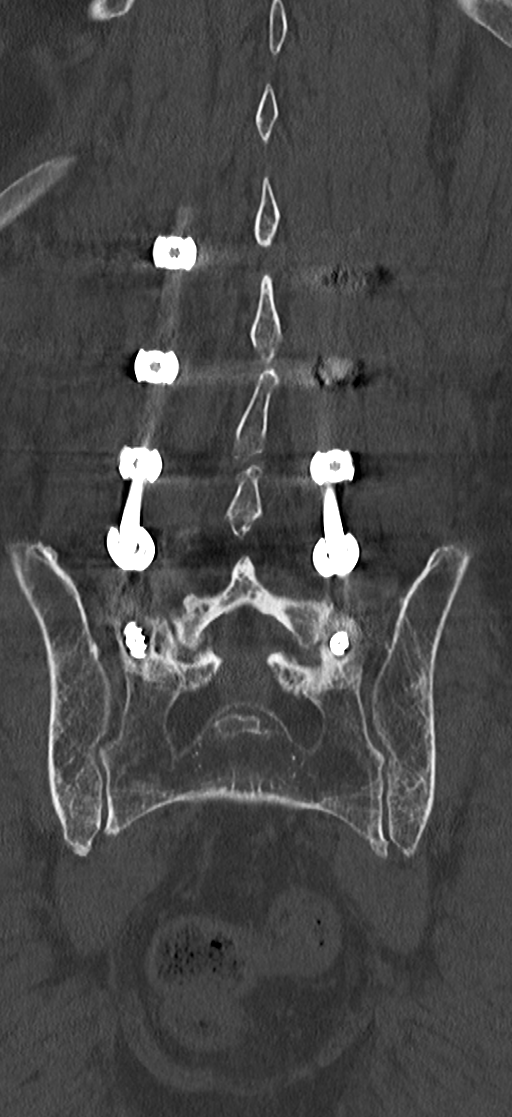

[9 of 33 positions shown; findings below may reference images not displayed]

FINDINGS: Segmentation: 5 lumbar type vertebral bodies as numbered previously.

Alignment: Normal

Vertebrae: No evidence of fracture or primary bone lesion. See
below.

Paraspinal and other soft tissues: Negative

Disc levels: T11-12 and T12-L1: Within normal limits.

L1-2: Mild bulging of the disc. Mild bilateral facet osteoarthritis.
Mild narrowing of the lateral recesses and foramina. This is
slightly more pronounced on the left. Some potential the left S1
nerve could be affected.

L2 through L5: Previous discectomy and fusion procedure. Fusion
appears solid through that segment. Sufficient patency of the canal
and foramina.

L5-S1: Previous discectomy and fusion procedure. Chronic nonunion.
Interbody spacer to the right sign of midline but within the disc
space. Nitrogen gas within the disc space indicating motion. Lucency
surrounding the S1 screws indicating motion. Potential for neural
compression in the right lateral recess and intervertebral foramen
on the right.

Mild sacroiliac osteoarthritis on both sides.
IMPRESSION: Solid union from L2 through L5 with sufficient patency of the canal
and foramina.

Nonunion at the L5-S1 level. Nitrogen gas in the disc space. Lucency
surrounding the S1 screws. Potential for neural compression in the
right lateral recess and intervertebral foramen on the right.

L1-2 shows bulging of the disc more prominent towards the left. Mild
facet degeneration and hypertrophy. Mild stenosis of the lateral
recesses and foramina left more than right. In this patient with
left hip pain, the left L1 nerve could possibly be affected.

## 2020-02-14 NOTE — Progress Notes (Signed)
Patient ID: Gary Everett., male    DOB: 15-Oct-1955, 64 y.o.   MRN: [id number]  PCP: Towanda Malkin, MD  Chief Complaint  Patient presents with  . New Patient (Initial Visit)  . Diabetes    Subjective:   Gary Everett. is a 64 y.o. male, presents to clinic with CC of the following:  Chief Complaint  Patient presents with  . New Patient (Initial Visit)  . Diabetes    HPI:  Patient is a 64 year old male who presents new to the practice He had been seeing internal medicine at [facility] clinic, with his last visit there 12/13/2019.   DM - Recent lab work from March 2021 in the [facility] system shows his A1c up to 8.2 with a fasting blood sugar 150.  His comp panel from the blood draw was otherwise entirely normal +FH - mom's side Trying to watch his diet.  Checking BS's at home prior, checked yesterday - 203, first time in a week. Medication - metformin - 1000mg  bid, glipizide off of it for two-plus weeks, took about 28 days Notes that he is going on Medicare and will not be able to afford the [place] (took it for a year prior, not on in the very recent past).  Denies, increased thirst, frequency, marked fatigue, up once a night to pee Not on statin Not on ACE Notes did just see the eye doctor, and got new glasses.  Denied any retinopathy history.   Hyperlipidemia Last lipid panel in September 2020 in the [facility] system showed a total cholesterol 198, LDL 135  Medication - none, noted he has tried medicines in the past, and have not been all that effective.  He did not think he was on a statin before, although in his allergies, it is noted that he had myalgias with statins in the past.  He notes he drinks vinegar and thinks that is helpful.  Spent a lot of time strongly advocating statins in his case, and why I would strongly encourage him to start one, low dose and assess his response. Denies recent chest pains, shortness of breath, no increased lower extremity  swelling  Overweight Wt Readings from Last 3 Encounters:  02/15/20 171 lb 4.8 oz (77.7 kg)  12/18/16 183 lb (83 kg)  12/16/16 183 lb (83 kg)  States "eat when I'm hungry"  Not very restricted with his diet presently.   Colon cancer screening-due in 2024 for colonoscopy was noted by his [facility] clinician No FH No recent dark/black stools, chronic abdominal pains,  Prostate cancer screening -PSAs followed in the [facility] system have been good, from 2017-2020. + FH - on father's side, his brothers Up once a night to urinate, no urgency, hesitancy, hematuria   Arthritis -last visit with rheumatology was in the fall 2020 at [place] clinic.  Takes meloxicam.  Was responding to anti-inflammatory management and a PPI was added at that visit but he is not taking presently, with an injection not felt needed at that time of the [facility] joint. He notes he has done relatively well in the recent past with no major arthritic concerns, he thinks taking the meloxicam is very helpful.  He has had prior back surgery with a fusion done in 2020 He has a history of kidney stones  Tobacco-former smoker, approximately 20-pack-year Alcohol-one beer a day (helps from having leg cramps - cannot explain) Retired 2012, drives a bus now, has a farm and active  Patient Active Problem  List   Diagnosis Date Noted  . Overweight (BMI 25.0-29.9) 02/15/2020  . Advance care planning 01/22/2015  . Routine general medical examination at a health care facility 11/04/2013  . Colitis 06/05/2013  . Bilateral inguinal hernia (BIH) L>R 12/07/2012  . Dupuytren contracture 12/07/2012  . Administrative encounter 09/14/2012  . FH: prostate cancer 12/04/2011  . Diverticulosis 01/08/2011  . 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 HEMORRHOIDS, WITH BLEEDING 07/03/2010  . Diabetes mellitus without complication ([place]) [id number]  . HLD (hyperlipidemia) 05/23/2004  . OSTEOARTHROSIS NOS, UNSPECIFIED SITE 01/20/2001   . Former smoker 09/22/1970      Current Outpatient Medications:  .  Garlic (ODOR FREE GARLIC) 123XX123 MG TABS, Take by mouth daily.  , Disp: , Rfl:  .  glucose blood (ONE TOUCH ULTRA TEST) test strip, CHECK BLOOD SUGAR ONCE DAILY AND AS NEEDED (dx. DM E11.9), Disp: 100 each, Rfl: 1 .  meloxicam (MOBIC) 7.5 MG tablet, Take 7.5 mg by mouth daily., Disp: , Rfl:  .  metFORMIN (GLUCOPHAGE) 1000 MG tablet, Take 1,000 mg by mouth 2 (two) times daily with a meal. , Disp: , Rfl:  .  ONETOUCH DELICA LANCETS 99991111 MISC, CHECK BLOOD SUGAR ONCE DAILY AND AS NEEDED (dx. DM E11.9), Disp: 100 each, Rfl: 1 .  glipiZIDE (GLUCOTROL XL) 2.5 MG 24 hr tablet, Take by mouth., Disp: , Rfl:    Allergies  Allergen Reactions  . Contrast Media [Iodinated Diagnostic Agents]     rash  . Pravastatin     myalgias  . Simvastatin     Other reaction(s): Other (See Comments) myalgias myalgias     Past Surgical History:  Procedure Laterality Date  . BACK SURGERY    . CERVICAL FUSION  1998   4/5/6 Dr. Carloyn Manner  . EXTRACORPOREAL SHOCK WAVE LITHOTRIPSY Right 12/18/2016   Procedure: EXTRACORPOREAL SHOCK WAVE LITHOTRIPSY (ESWL);  Surgeon: Hollice Espy, MD;  Location: [facility] ORS;  Service: Urology;  Laterality: Right;  . FOOT SURGERY  2009  . KIDNEY STONE SURGERY  1979-present  . NECK SURGERY    . SEPTOPLASTY  1999   [place]     Family History  Problem Relation Age of Onset  . Diabetes Mother   . Kidney disease Mother        Renal Insuff  . Heart disease Mother        CAD  . COPD Mother   . Peripheral vascular disease Mother   . Hypertension Brother   . Lupus Sister   . COPD Sister   . Mental illness Sister        Bipolar  . Kidney Stones Father   . Prostate cancer Paternal Uncle   . Colon cancer Neg Hx   . Hematuria Neg Hx   . Renal cancer Neg Hx   . Sickle cell anemia Neg Hx      Social History   Tobacco Use  . Smoking status: Former Smoker    Packs/day: 0.50    Years: 40.00    Pack years: 20.00     Types: Cigarettes  . Smokeless tobacco: Never Used  Substance Use Topics  . Alcohol use: No    Alcohol/week: 0.0 standard drinks    Comment: essentially none, very rarely    With staff assistance, above reviewed with the patient today.  ROS: As per HPI, otherwise no specific complaints on a limited and focused system review   No results found for this or any previous visit (from the past 72 hour(s)).  PHQ2/9: No flowsheet data found. PHQ-2/9 Result is   Fall Risk: Fall Risk  02/15/2020  Falls in the past year? 0  Number falls in past yr: 0  Injury with Fall? 0      Objective:   Vitals:   02/15/20 1313  BP: 114/80  Pulse: 96  Resp: 16  Temp: 97.6 F (36.4 C)  TempSrc: Temporal  SpO2: 99%  Weight: 171 lb 4.8 oz (77.7 kg)  Height: 5\' 7"  (1.702 m)    Body mass index is 26.83 kg/m.  Physical Exam   NAD, masked HEENT - [place]/AT, sclera anicteric, positive glasses, PERRL, EOMI, conj - non-inj'ed, TM's and canals clear, pharynx clear Neck - supple, no adenopathy, no TM, carotids 2+ and = without bruits bilat Car - RRR without m/g/r Pulm- RR and effort normal at rest, CTA without wheeze or rales Abd - soft, NT, ND, BS+,  no masses, no HSM Back - no CVA tenderness Skin- no rash noted on exposed areas  GU -prostate exam deferred as recent PSAs good, and no recent concerning symptom Ext - no LE edema, no active joints Neuro/psychiatric - affect was not flat, appropriate with conversation  Alert and oriented  Grossly non-focal - good strength on testing extremities, sensation intact to LT in distal extremities, DTRs 2+ and equal in the patella, Romberg was negative, no pronator drift, good finger-to-nose, gait was normal with good tandem walk,  Speech  normal       Assessment & Plan:  1. Encounter to establish care with new doctor   2. Diabetes mellitus without complication ([place]) Discussed today concerns that his blood sugars have not been well controlled  recently, and would expect his A1c if anything to be higher on check as she has stopped the glipizide, remains on the Metformin, and his blood sugar checked at home was over 200 on last check.  Does not follow strict dietary modifications. Emphasized the importance of getting his blood sugars under better control, and he was understanding of that noting other family members that had early heart disease who are diabetic. Encouraged better dietary modifications, with information included in the AVS today. We will check labs, and would prefer fasting as we would like to get a lipid panel, and he can return next week to have fasting labs done. We will then schedule a follow-up visit the following week, as do feel we will need to alter his medication regimen, and he is very involved in helping decide what medicines to add to help gain better blood sugar control I did see his allergy history, but do feel a low-dose statin is worthy of a try again, and hope you will agree.  3. Mixed hyperlipidemia We will check fasting labs and await that result. Noted the goal of LDL less than 70. Again, would strongly encourage retrying a low-dose statin and await follow-up visit after the labs are obtained  4. FH: prostate cancer Recent PSA checks have been good. We will continue to follow PSAs over time for screening purposes.  5. Screening for prostate cancer As above.  6. Screening for colorectal cancer It was noted from his prior clinician that he was due in 2024 for his next colonoscopy.  The patient noted he has had a couple in the past, and does seem that that is about right to him. Also discussed Cologuard as another option, and will not rush to that presently  7. Overweight (BMI 25.0-29.9) Rate has been fairly well controlled in the  recent past, and did discuss the importance of weight management and helping make sure his sugars are better controlled.  8. Osteoarthritis, unspecified osteoarthritis  type, unspecified site Has seen rheumatology at [facility], and can continue to follow-up there as needed. He notes his symptoms have been fairly well controlled in the recent past.   Patient will get fasting labs next week, follow-up again the following week, and anticipate needing to add medicines for better blood sugar control in addition to trying to see if he will try a statin again.  Can follow-up sooner as needed.    Towanda Malkin, MD 02/15/20 1:25 PM

## 2020-02-15 ENCOUNTER — Ambulatory Visit (INDEPENDENT_AMBULATORY_CARE_PROVIDER_SITE_OTHER): Payer: Medicare Other | Admitting: Internal Medicine

## 2020-02-15 ENCOUNTER — Encounter: Payer: Self-pay | Admitting: Internal Medicine

## 2020-02-15 ENCOUNTER — Other Ambulatory Visit: Payer: Self-pay

## 2020-02-15 VITALS — BP 114/80 | HR 96 | Temp 97.6°F | Resp 16 | Ht 67.0 in | Wt 171.3 lb

## 2020-02-15 DIAGNOSIS — E119 Type 2 diabetes mellitus without complications: Secondary | ICD-10-CM | POA: Diagnosis not present

## 2020-02-15 DIAGNOSIS — E663 Overweight: Secondary | ICD-10-CM

## 2020-02-15 DIAGNOSIS — Z8042 Family history of malignant neoplasm of prostate: Secondary | ICD-10-CM | POA: Diagnosis not present

## 2020-02-15 DIAGNOSIS — M1990 Unspecified osteoarthritis, unspecified site: Secondary | ICD-10-CM

## 2020-02-15 DIAGNOSIS — Z1211 Encounter for screening for malignant neoplasm of colon: Secondary | ICD-10-CM

## 2020-02-15 DIAGNOSIS — E782 Mixed hyperlipidemia: Secondary | ICD-10-CM

## 2020-02-15 DIAGNOSIS — Z1212 Encounter for screening for malignant neoplasm of rectum: Secondary | ICD-10-CM

## 2020-02-15 DIAGNOSIS — Z7689 Persons encountering health services in other specified circumstances: Secondary | ICD-10-CM | POA: Diagnosis not present

## 2020-02-15 DIAGNOSIS — Z125 Encounter for screening for malignant neoplasm of prostate: Secondary | ICD-10-CM

## 2020-02-15 NOTE — Patient Instructions (Signed)
Type 2 Diabetes Mellitus, Self Care, Adult When you have type 2 diabetes (type 2 diabetes mellitus), you must make sure your blood sugar (glucose) stays in a healthy range. You can do this with:  Nutrition.  Exercise.  Lifestyle changes.  Medicines or insulin, if needed.  Support from your doctors and others. How to stay aware of blood sugar   Check your blood sugar level every day, as often as told.  Have your A1c (hemoglobin A1c) level checked two or more times a year. Have it checked more often if your doctor tells you to. Your doctor will set personal treatment goals for you. Generally, you should have these blood sugar levels:  Before meals (preprandial): 80-130 mg/dL (4.4-7.2 mmol/L).  After meals (postprandial): below 180 mg/dL (10 mmol/L).  A1c level: less than 7%. How to manage high and low blood sugar Signs of high blood sugar High blood sugar is called hyperglycemia. Know the signs of high blood sugar. Signs may include:  Feeling: ? Thirsty. ? Hungry. ? Very tired.  Needing to pee (urinate) more than usual.  Blurry vision. Signs of low blood sugar Low blood sugar is called hypoglycemia. This is when blood sugar is at or below 70 mg/dL (3.9 mmol/L). Signs may include:  Feeling: ? Hungry. ? Worried or nervous (anxious). ? Sweaty and clammy. ? Confused. ? Dizzy. ? Sleepy. ? Sick to your stomach (nauseous).  Having: ? A fast heartbeat. ? A headache. ? A change in your vision. ? Jerky movements that you cannot control (seizure). ? Tingling or no feeling (numbness) around your mouth, lips, or tongue.  Having trouble with: ? Moving (coordination). ? Sleeping. ? Passing out (fainting). ? Getting upset easily (irritability). Treating low blood sugar To treat low blood sugar, eat or drink something sugary right away. If you can think clearly and swallow safely, follow the 15:15 rule:  Take 15 grams of a fast-acting carb (carbohydrate). Talk with your  doctor about how much you should take.  Some fast-acting carbs are: ? Sugar tablets (glucose pills). Take 3-4 pills. ? 6-8 pieces of hard candy. ? 4-6 oz (120-150 mL) of fruit juice. ? 4-6 oz (120-150 mL) of regular (not diet) soda. ? 1 Tbsp (15 mL) honey or sugar.  Check your blood sugar 15 minutes after you take the carb.  If your blood sugar is still at or below 70 mg/dL (3.9 mmol/L), take 15 grams of a carb again.  If your blood sugar does not go above 70 mg/dL (3.9 mmol/L) after 3 tries, get help right away.  After your blood sugar goes back to normal, eat a meal or a snack within 1 hour. Treating very low blood sugar If your blood sugar is at or below 54 mg/dL (3 mmol/L), you have very low blood sugar (severe hypoglycemia). This is an emergency. Do not wait to see if the symptoms will go away. Get medical help right away. Call your local emergency services (911 in the [place] If you have very low blood sugar and you cannot eat or drink, you may need a glucagon shot (injection). A family member or friend should learn how to check your blood sugar and how to give you a glucagon shot. Ask your doctor if you need to have a glucagon shot kit at home. Follow these instructions at home: Medicine  Take insulin and diabetes medicines as told.  If your doctor says you should take more or less insulin and medicines, do this exactly as told.  Do not run out of insulin or medicines. Having diabetes can raise your risk for other long-term conditions. These include heart disease and kidney disease. Your doctor may prescribe medicines to help you not have these problems. Food   Make healthy food choices. These include: ? Chicken, fish, egg whites, and beans. ? Oats, whole wheat, bulgur, brown rice, quinoa, and millet. ? Fresh fruits and vegetables. ? Low-fat dairy products. ? Nuts, avocado, olive oil, and canola oil.  Meet with a food specialist (dietitian). He or she can help you make an  eating plan that is right for you.  Follow instructions from your doctor about what you cannot eat or drink.  Drink enough fluid to keep your pee (urine) pale yellow.  Keep track of carbs that you eat. Do this by reading food labels and learning food serving sizes.  Follow your sick day plan when you cannot eat or drink normally. Make this plan with your doctor so it is ready to use. Activity  Exercise 3 or more times a week.  Do not go more than 2 days without exercising.  Talk with your doctor before you start a new exercise. Your doctor may need to tell you to change: ? How much insulin or medicines you take. ? How much food you eat. Lifestyle  Do not use any tobacco products. These include cigarettes, chewing tobacco, and e-cigarettes. If you need help quitting, ask your doctor.  Ask your doctor how much alcohol is safe for you.  Learn to deal with stress. If you need help with this, ask your doctor. Body care   Stay up to date with your shots (immunizations).  Have your eyes and feet checked by a doctor as often as told.  Check your skin and feet every day. Check for cuts, bruises, redness, blisters, or sores.  Brush your teeth and gums two times a day. Floss one or more times a day.  Go to the dentist one or more times every 6 months.  Stay at a healthy weight. General instructions  Take over-the-counter and prescription medicines only as told by your doctor.  Share your diabetes care plan with: ? Your work or school. ? People you live with.  Carry a card or wear jewelry that says you have diabetes.  Keep all follow-up visits as told by your doctor. This is important. Questions to ask your doctor  Do I need to meet with a diabetes educator?  Where can I find a support group for people with diabetes? Where to find more information To learn more about diabetes, visit:  American Diabetes Association: www.diabetes.org  American Association of Diabetes  Educators: www.diabeteseducator.org Summary  When you have type 2 diabetes, you must make sure your blood sugar (glucose) stays in a healthy range.  Check your blood sugar every day, as often as told.  Having diabetes can raise your risk for other conditions. Your doctor may prescribe medicines to help you not have these problems.  Keep all follow-up visits as told by your doctor. This is important. This information is not intended to replace advice given to you by your health care provider. Make sure you discuss any questions you have with your health care provider. Document Revised: 03/01/2018 Document Reviewed: 10/12/2015 Elsevier Patient Education  2020 Elsevier Inc.   Diabetes Mellitus and Nutrition, Adult When you have diabetes (diabetes mellitus), it is very important to have healthy eating habits because your blood sugar (glucose) levels are greatly affected by what you   eat and drink. Eating healthy foods in the appropriate amounts, at about the same times every day, can help you:  Control your blood glucose.  Lower your risk of heart disease.  Improve your blood pressure.  Reach or maintain a healthy weight. Every person with diabetes is different, and each person has different needs for a meal plan. Your health care provider may recommend that you work with a diet and nutrition specialist (dietitian) to make a meal plan that is best for you. Your meal plan may vary depending on factors such as:  The calories you need.  The medicines you take.  Your weight.  Your blood glucose, blood pressure, and cholesterol levels.  Your activity level.  Other health conditions you have, such as heart or kidney disease. How do carbohydrates affect me? Carbohydrates, also called carbs, affect your blood glucose level more than any other type of food. Eating carbs naturally raises the amount of glucose in your blood. Carb counting is a method for keeping track of how many carbs you  eat. Counting carbs is important to keep your blood glucose at a healthy level, especially if you use insulin or take certain oral diabetes medicines. It is important to know how many carbs you can safely have in each meal. This is different for every person. Your dietitian can help you calculate how many carbs you should have at each meal and for each snack. Foods that contain carbs include:  Bread, cereal, rice, pasta, and crackers.  Potatoes and corn.  Peas, beans, and lentils.  Milk and yogurt.  Fruit and juice.  Desserts, such as cakes, cookies, ice cream, and candy. How does alcohol affect me? Alcohol can cause a sudden decrease in blood glucose (hypoglycemia), especially if you use insulin or take certain oral diabetes medicines. Hypoglycemia can be a life-threatening condition. Symptoms of hypoglycemia (sleepiness, dizziness, and confusion) are similar to symptoms of having too much alcohol. If your health care provider says that alcohol is safe for you, follow these guidelines:  Limit alcohol intake to no more than 1 drink per day for nonpregnant women and 2 drinks per day for men. One drink equals 12 oz of beer, 5 oz of wine, or 1 oz of hard liquor.  Do not drink on an empty stomach.  Keep yourself hydrated with water, diet soda, or unsweetened iced tea.  Keep in mind that regular soda, juice, and other mixers may contain a lot of sugar and must be counted as carbs. What are tips for following this plan?  Reading food labels  Start by checking the serving size on the "Nutrition Facts" label of packaged foods and drinks. The amount of calories, carbs, fats, and other nutrients listed on the label is based on one serving of the item. Many items contain more than one serving per package.  Check the total grams (g) of carbs in one serving. You can calculate the number of servings of carbs in one serving by dividing the total carbs by 15. For example, if a food has 30 g of total  carbs, it would be equal to 2 servings of carbs.  Check the number of grams (g) of saturated and trans fats in one serving. Choose foods that have low or no amount of these fats.  Check the number of milligrams (mg) of salt (sodium) in one serving. Most people should limit total sodium intake to less than 2,300 mg per day.  Always check the nutrition information of foods labeled   as "low-fat" or "nonfat". These foods may be higher in added sugar or refined carbs and should be avoided.  Talk to your dietitian to identify your daily goals for nutrients listed on the label. Shopping  Avoid buying canned, premade, or processed foods. These foods tend to be high in fat, sodium, and added sugar.  Shop around the outside edge of the grocery store. This includes fresh fruits and vegetables, bulk grains, fresh meats, and fresh dairy. Cooking  Use low-heat cooking methods, such as baking, instead of high-heat cooking methods like deep frying.  Cook using healthy oils, such as olive, canola, or sunflower oil.  Avoid cooking with butter, cream, or high-fat meats. Meal planning  Eat meals and snacks regularly, preferably at the same times every day. Avoid going long periods of time without eating.  Eat foods high in fiber, such as fresh fruits, vegetables, beans, and whole grains. Talk to your dietitian about how many servings of carbs you can eat at each meal.  Eat 4-6 ounces (oz) of lean protein each day, such as lean meat, chicken, fish, eggs, or tofu. One oz of lean protein is equal to: ? 1 oz of meat, chicken, or fish. ? 1 egg. ?  cup of tofu.  Eat some foods each day that contain healthy fats, such as avocado, nuts, seeds, and fish. Lifestyle  Check your blood glucose regularly.  Exercise regularly as told by your health care provider. This may include: ? 150 minutes of moderate-intensity or vigorous-intensity exercise each week. This could be brisk walking, biking, or water  aerobics. ? Stretching and doing strength exercises, such as yoga or weightlifting, at least 2 times a week.  Take medicines as told by your health care provider.  Do not use any products that contain nicotine or tobacco, such as cigarettes and e-cigarettes. If you need help quitting, ask your health care provider.  Work with a counselor or diabetes educator to identify strategies to manage stress and any emotional and social challenges. Questions to ask a health care provider  Do I need to meet with a diabetes educator?  Do I need to meet with a dietitian?  What number can I call if I have questions?  When are the best times to check my blood glucose? Where to find more information:  American Diabetes Association: diabetes.org  Academy of Nutrition and Dietetics: www.eatright.org  National Institute of Diabetes and Digestive and Kidney Diseases (NIH): www.niddk.nih.gov Summary  A healthy meal plan will help you control your blood glucose and maintain a healthy lifestyle.  Working with a diet and nutrition specialist (dietitian) can help you make a meal plan that is best for you.  Keep in mind that carbohydrates (carbs) and alcohol have immediate effects on your blood glucose levels. It is important to count carbs and to use alcohol carefully. This information is not intended to replace advice given to you by your health care provider. Make sure you discuss any questions you have with your health care provider. Document Revised: 08/21/2017 Document Reviewed: 10/13/2016 Elsevier Patient Education  2020 Elsevier Inc.   

## 2020-02-28 NOTE — Progress Notes (Signed)
Patient ID: Gary Quaintance., male    DOB: May 05, 1956, 64 y.o.   MRN: [id number]  PCP: Towanda Malkin, MD  Chief Complaint  Patient presents with  . Follow-up  . Diabetes  . Hyperlipidemia    Subjective:   Gary Manninen. is a 64 y.o. male, presents to clinic with CC of the following:  Chief Complaint  Patient presents with  . Follow-up  . Diabetes  . Hyperlipidemia    HPI:  Patient is a 64 year old male who presented a couple weeks ago to establish with the practice. That note was reviewed. Fasting labs were ordered, and to follow-up today after those labs were obtained. Unfortunately, he did not present to have those labs done. Follows up today. He states he feels good presently.  Has no complaints today.  Diabetes mellitus without complication (HCC) Medication regimen-Metformin 1000 mg twice daily, still off glipizide, and stopped the [place] previously due to the cost of the medicine, and he states he will not go back on that due to the cost. Last A1c in March 2021 was 8.2 Not on a statin, not on ACE Did just have a recent eye doctor visit Denies increased thirst, increased urination, no numbness or tingling in ext's No CP, palp's, SOB Diet -  Does not follow strict dietary modifications.  He noted his diet has not changed in years, and cannot understand why his sugars would be not as well controlled over time.  I spent time today educating him about diabetes and the natural progression that it often takes. Emphasized last visit and again today the importance of getting his blood sugars under better control, and he was understanding of that noting other family members that had early heart disease who are diabetic. Encouraged better dietary modifications last visit and again today as well.   Mixed hyperlipidemia Not on a statin presently Last LDL was 135 from September 2020 He was very adamant about not wanting to start a statin due to bad cramps and  muscle spasms when on these in the past. We will check fasting labs and await that result. Noted the goal of LDL less than 70. Again, would strongly encourage retrying a low-dose statin and yet he did not want to do so.  Also discussed potentially other medicines like a Zetia product that may be tried.  FH: prostate cancer screening Recent PSA checks have been good, and were followed from 2017-2020 Positive family history on his father's side, his brothers Up once a night to urinate, no urgency, hesitancy, hematuria We will continue to follow PSAs over time for screening purposes was planned after last visit   Screening for colorectal cancer It was noted from his prior clinician that he was due in 2024 for his next colonoscopy.  The patient noted he has had a couple in the past, and does seem that that is about right to him. Also discussed Cologuard as another option, and will not rush to that presently  Overweight (BMI 25.0-29.9) Wt Readings from Last 3 Encounters:  02/29/20 172 lb (78 kg)  02/15/20 171 lb 4.8 oz (77.7 kg)  12/18/16 183 lb (83 kg)   Weight has remained stable since last visit   Patient Active Problem List   Diagnosis Date Noted  . Overweight (BMI 25.0-29.9) 02/15/2020  . Advance care planning 01/22/2015  . Routine general medical examination at a health care facility 11/04/2013  . Colitis 06/05/2013  . Bilateral inguinal hernia (BIH) L>R  12/07/2012  . Dupuytren contracture 12/07/2012  . Administrative encounter 09/14/2012  . FH: prostate cancer 12/04/2011  . Diverticulosis 01/08/2011  . 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 HEMORRHOIDS, WITH BLEEDING 07/03/2010  . Diabetes mellitus without complication ([place]) 38/25/0539  . HLD (hyperlipidemia) 05/23/2004  . Osteoarthritis 01/20/2001  . Former smoker 09/22/1970      Current Outpatient Medications:  .  fluticasone (FLONASE) 50 MCG/ACT nasal spray, Place into both nostrils daily.,  Disp: , Rfl:  .  Garlic (ODOR FREE GARLIC) 767 MG TABS, Take by mouth daily.  , Disp: , Rfl:  .  glipiZIDE (GLUCOTROL XL) 2.5 MG 24 hr tablet, Take by mouth., Disp: , Rfl:  .  glucose blood (ONE TOUCH ULTRA TEST) test strip, CHECK BLOOD SUGAR ONCE DAILY AND AS NEEDED (dx. DM E11.9), Disp: 100 each, Rfl: 1 .  meloxicam (MOBIC) 7.5 MG tablet, Take 7.5 mg by mouth daily., Disp: , Rfl:  .  metFORMIN (GLUCOPHAGE) 1000 MG tablet, Take 1,000 mg by mouth 2 (two) times daily with a meal. , Disp: , Rfl:  .  ONETOUCH DELICA LANCETS 34L MISC, CHECK BLOOD SUGAR ONCE DAILY AND AS NEEDED (dx. DM E11.9), Disp: 100 each, Rfl: 1   Allergies  Allergen Reactions  . Contrast Media [Iodinated Diagnostic Agents]     rash  . Pravastatin     myalgias  . Simvastatin     Other reaction(s): Other (See Comments) myalgias myalgias     Past Surgical History:  Procedure Laterality Date  . BACK SURGERY    . CERVICAL FUSION  1998   4/5/6 Dr. Carloyn Manner  . EXTRACORPOREAL SHOCK WAVE LITHOTRIPSY Right 12/18/2016   Procedure: EXTRACORPOREAL SHOCK WAVE LITHOTRIPSY (ESWL);  Surgeon: Hollice Espy, MD;  Location: [facility] ORS;  Service: Urology;  Laterality: Right;  . FOOT SURGERY  2009  . KIDNEY STONE SURGERY  1979-present  . NECK SURGERY    . SEPTOPLASTY  1999   [place]     Family History  Problem Relation Age of Onset  . Diabetes Mother   . Kidney disease Mother        Renal Insuff  . Heart disease Mother        CAD  . COPD Mother   . Peripheral vascular disease Mother   . Hypertension Brother   . Lupus Sister   . COPD Sister   . Mental illness Sister        Bipolar  . Kidney Stones Father   . Prostate cancer Paternal Uncle   . Colon cancer Neg Hx   . Hematuria Neg Hx   . Renal cancer Neg Hx   . Sickle cell anemia Neg Hx      Social History   Tobacco Use  . Smoking status: Former Smoker    Packs/day: 0.50    Years: 40.00    Pack years: 20.00    Types: Cigarettes  . Smokeless tobacco: Never Used    Substance Use Topics  . Alcohol use: No    Alcohol/week: 0.0 standard drinks    Comment: essentially none, very rarely    With staff assistance, above reviewed with the patient today.  ROS: As per HPI, otherwise no specific complaints on a limited and focused system review   No results found for this or any previous visit (from the past 72 hour(s)).   PHQ2/9: Depression screen [facility] 2/9 02/29/2020 02/15/2020  Decreased Interest 0 0  Down, Depressed, Hopeless 0 0  PHQ - 2 Score 0  0  Altered sleeping 0 0  Tired, decreased energy 0 0  Change in appetite 0 0  Feeling bad or failure about yourself  0 0  Trouble concentrating 0 0  Moving slowly or fidgety/restless 0 0  Suicidal thoughts 0 0  PHQ-9 Score 0 0  Difficult doing work/chores Not difficult at all Not difficult at all   PHQ-2/9 Result is neg  Fall Risk: Fall Risk  02/29/2020 02/15/2020  Falls in the past year? 0 0  Number falls in past yr: 0 0  Injury with Fall? 0 0      Objective:   Vitals:   02/29/20 0742  BP: 130/86  Pulse: 77  Resp: 16  Temp: 97.9 F (36.6 C)  TempSrc: Temporal  SpO2: 99%  Weight: 172 lb (78 kg)  Height: 5\' 7"  (1.702 m)    Body mass index is 26.94 kg/m.  Physical Exam   NAD, masked HEENT - [place]/AT, sclera anicteric, PERRL, EOMI, conj - non-inj'ed, pharynx clear Neck - supple, no adenopathy, carotids 2+ and = without bruits bilat Car - RRR without m/g/r Pulm- RR and effort normal at rest, CTA without wheeze or rales Abd - soft, NT diffusely,  Back - no CVA tenderness Ext - no LE edema,  Neuro/psychiatric - affect was not flat, appropriate with conversation  Alert and oriented  Grossly non-focal   Speech normal   Results for orders placed or performed during the hospital encounter of 12/18/16  Glucose, capillary  Result Value Ref Range   Glucose-Capillary 156 (H) 65 - 99 mg/dL       Assessment & Plan:   1. Diabetes mellitus without complication ([place]) As noted previously, do  expect his A1c to still be above desired range, based on recent checks, and him only being on Metformin presently. We will get labs today. Discussed adding a medicine like Farxiga or similar in that class if his blood sugars are elevated, and he was very adamant about not going on that medicine again due to its cost.  Looking at some coupons and brochures we had in the office, they noted were not applicable for Medicare.  May need to return to a glipizide type product to see if that is helpful, as that was more cost efficient for him.  Unfortunately, that medicine is not nearly as effective and helpful in this situation in my opinion. Await labs presently, and may try to consult chronic care management with the pharmacy person involved to help with medication options that are more ideal over time.  2. Mixed hyperlipidemia He noted an intolerance to statins, and did try 2 different ones.  He noted the cramps were very painful and he was not willing to try a another statin, even a lower dose if needed as expected.  (Trying to reach the goal of an LDL less than 70).  Also noted the benefits of statins with respect to helping prevent heart disease over time. Await lab results presently, and likely will need to try a different medicine outside of the statin class to help with his lipid status.  3. FH: prostate cancer As above, PSAs have been good previously, and will continue to monitor intermittently over time for screening purposes.  4. Overweight (BMI 25.0-29.9) His weight has remained fairly stable since last visit, and the importance of healthy weight maintenance emphasized again today as well.  Await labs today, with limitations in medication choices to help noted above when those results return. Follow-up in approximately 4  months, sooner as needed.   Towanda Malkin, MD 02/29/20 7:54 AM

## 2020-02-29 ENCOUNTER — Other Ambulatory Visit: Payer: Self-pay

## 2020-02-29 ENCOUNTER — Encounter: Payer: Self-pay | Admitting: Internal Medicine

## 2020-02-29 ENCOUNTER — Other Ambulatory Visit: Payer: Self-pay | Admitting: Internal Medicine

## 2020-02-29 ENCOUNTER — Ambulatory Visit (INDEPENDENT_AMBULATORY_CARE_PROVIDER_SITE_OTHER): Payer: Medicare Other | Admitting: Internal Medicine

## 2020-02-29 VITALS — BP 130/86 | HR 77 | Temp 97.9°F | Resp 16 | Ht 67.0 in | Wt 172.0 lb

## 2020-02-29 DIAGNOSIS — E663 Overweight: Secondary | ICD-10-CM

## 2020-02-29 DIAGNOSIS — E782 Mixed hyperlipidemia: Secondary | ICD-10-CM

## 2020-02-29 DIAGNOSIS — Z8042 Family history of malignant neoplasm of prostate: Secondary | ICD-10-CM | POA: Diagnosis not present

## 2020-02-29 DIAGNOSIS — E119 Type 2 diabetes mellitus without complications: Secondary | ICD-10-CM | POA: Diagnosis not present

## 2020-02-29 NOTE — Patient Instructions (Signed)

## 2020-03-01 ENCOUNTER — Other Ambulatory Visit: Payer: Self-pay | Admitting: Internal Medicine

## 2020-03-01 DIAGNOSIS — E782 Mixed hyperlipidemia: Secondary | ICD-10-CM

## 2020-03-01 DIAGNOSIS — E119 Type 2 diabetes mellitus without complications: Secondary | ICD-10-CM

## 2020-03-01 LAB — COMPLETE METABOLIC PANEL WITH GFR
AG Ratio: 2.5 (calc) (ref 1.0–2.5)
ALT: 14 U/L (ref 9–46)
AST: 13 U/L (ref 10–35)
Albumin: 4.8 g/dL (ref 3.6–5.1)
Alkaline phosphatase (APISO): 49 U/L (ref 35–144)
BUN: 20 mg/dL (ref 7–25)
CO2: 28 mmol/L (ref 20–32)
Calcium: 9.8 mg/dL (ref 8.6–10.3)
Chloride: 103 mmol/L (ref 98–110)
Creat: 0.99 mg/dL (ref 0.70–1.25)
GFR, Est African American: 92 mL/min/{1.73_m2} (ref >=60)
GFR, Est Non African American: 80 mL/min/{1.73_m2} (ref >=60)
Globulin: 1.9 g/dL (calc) (ref 1.9–3.7)
Glucose, Bld: 184 mg/dL — ABNORMAL HIGH (ref 65–99)
Potassium: 4.7 mmol/L (ref 3.5–5.3)
Sodium: 138 mmol/L (ref 135–146)
Total Bilirubin: 0.5 mg/dL (ref 0.2–1.2)
Total Protein: 6.7 g/dL (ref 6.1–8.1)

## 2020-03-01 LAB — CBC WITH DIFFERENTIAL/PLATELET
Absolute Monocytes: 428 cells/uL (ref 200–950)
Basophils Absolute: 51 cells/uL (ref 0–200)
Basophils Relative: 1.1 %
Eosinophils Absolute: 120 cells/uL (ref 15–500)
Eosinophils Relative: 2.6 %
HCT: 46.1 % (ref 38.5–50.0)
Hemoglobin: 15.5 g/dL (ref 13.2–17.1)
Lymphs Abs: 1509 cells/uL (ref 850–3900)
MCH: 30.5 pg (ref 27.0–33.0)
MCHC: 33.6 g/dL (ref 32.0–36.0)
MCV: 90.6 fL (ref 80.0–100.0)
MPV: 9.3 fL (ref 7.5–12.5)
Monocytes Relative: 9.3 %
Neutro Abs: 2493 cells/uL (ref 1500–7800)
Neutrophils Relative %: 54.2 %
Platelets: 291 10*3/uL (ref 140–400)
RBC: 5.09 10*6/uL (ref 4.20–5.80)
RDW: 12.8 % (ref 11.0–15.0)
Total Lymphocyte: 32.8 %
WBC: 4.6 10*3/uL (ref 3.8–10.8)

## 2020-03-01 LAB — LIPID PANEL
Cholesterol: 211 mg/dL — ABNORMAL HIGH (ref <200)
HDL: 46 mg/dL (ref >=40)
LDL Cholesterol (Calc): 143 mg/dL (calc) — ABNORMAL HIGH
Non-HDL Cholesterol (Calc): 165 mg/dL (calc) — ABNORMAL HIGH (ref <130)
Total CHOL/HDL Ratio: 4.6 (calc) (ref <5.0)
Triglycerides: 103 mg/dL (ref <150)

## 2020-03-01 LAB — HEMOGLOBIN A1C
Hgb A1c MFr Bld: 7.3 % of total Hgb — ABNORMAL HIGH (ref <5.7)
Mean Plasma Glucose: 163 (calc)
eAG (mmol/L): 9 (calc)

## 2020-03-01 LAB — MICROALBUMIN / CREATININE URINE RATIO
Creatinine, Urine: 111 mg/dL (ref 20–320)
Microalb Creat Ratio: 2 mcg/mg creat (ref <30)
Microalb, Ur: 0.2 mg/dL

## 2020-03-01 LAB — TSH: TSH: 1.48 mIU/L (ref 0.40–4.50)

## 2020-03-01 MED ORDER — GLIPIZIDE ER 5 MG PO TB24: 5.0000 mg | ORAL_TABLET | Freq: Every day | ORAL | 3 refills | Status: DC

## 2020-03-01 MED ORDER — EZETIMIBE 10 MG PO TABS: 10.0000 mg | ORAL_TABLET | Freq: Every day | ORAL | 3 refills | Status: DC

## 2020-03-01 NOTE — Progress Notes (Signed)
Please see result note. Returning to the Glucotrol XL at a dose of 5 mg daily was recommended, and adding Zetia daily, as he is unable to take statins based on his intolerance in the past.

## 2020-04-06 ENCOUNTER — Telehealth: Payer: Self-pay | Admitting: Internal Medicine

## 2020-04-06 MED ORDER — GLIPIZIDE ER 5 MG PO TB24: 5.0000 mg | ORAL_TABLET | Freq: Every day | ORAL | 0 refills | Status: DC

## 2020-04-06 NOTE — Addendum Note (Signed)
Addended by: Delsa Grana on: 04/06/2020 01:10 PM   Modules accepted: Orders

## 2020-04-06 NOTE — Telephone Encounter (Signed)
Patient calling to make Dr. Roxan Hockey aware that he is taking Glimepiride 1 tab in the morning and 2 in the evening. He is requesting new RX that reflects dosage change. Please advise.      Patient also wanted to note that his blood sugar has been running around 140-155 in mornings.

## 2020-04-06 NOTE — Telephone Encounter (Signed)
Called patient to inform him Dr. Roxan Hockey is out of the office until Tuesday June 20. I want to confirm the patient is taking Glimepiride or if he is taking glipizide(on his med list). No answer, left him a vm.

## 2020-04-06 NOTE — Telephone Encounter (Signed)
PCP out of office Pt requesting refills on meds that he is not taking as prescribed by Dr. Roxan Hockey.  Lab Results  Component Value Date   HGBA1C 7.3 (H) 02/29/2020   A1C with recent OV up from prior labs and mildly uncontrolled after pt could not afford farxiga  Dr. Lemmie Evens advised: restarting glipizide "start a dose of 5 mg, and may need to increase that some over time depending on his response to this."  Pt taking 5 mg in the am and 10 mg qpm and going to run out  I do not know what his blood sugars are, and the pt should be titrating meds with his PCP.  I will send in only weekend supply and pt will need to f/up with PCP next week to address.    CMA to notify pt  Fasting blood sugar goals 80-140, postprandial blood sugar <180 and A1C to be 7.0 or under. Pt should be working on diet changes as well.  Pt will need to pay cash for meds since he is not taking as prescribed and I am not going to change management - pt will need to discuss with PCP for med refill to change.  Routed to [facility] - discussed with her as well.  Meds ordered this encounter  Medications  . glipiZIDE (GLUCOTROL XL) 5 MG 24 hr tablet    Sig: Take 1 tablet (5 mg total) by mouth daily with breakfast.    Dispense:  30 tablet    Refill:  0    Pt to pay cash - not taking Rx as prescribed    Order Specific Question:   Supervising Provider    Answer:   Steele Sizer [[id number]]     Delsa Grana, PA-C

## 2020-04-06 NOTE — Telephone Encounter (Signed)
Spoke with patient, he is taking Glipizide 5mg  1 tab in the morning and 2 tabs in the evening. The patient only has enough left for two days.

## 2020-04-09 ENCOUNTER — Other Ambulatory Visit: Payer: Self-pay | Admitting: Internal Medicine

## 2020-04-09 DIAGNOSIS — E119 Type 2 diabetes mellitus without complications: Secondary | ICD-10-CM

## 2020-04-09 MED ORDER — GLIPIZIDE ER 5 MG PO TB24: 5.0000 mg | ORAL_TABLET | Freq: Two times a day (BID) | ORAL | 1 refills | Status: DC

## 2020-04-09 NOTE — Telephone Encounter (Signed)
Please let patient know to only take Gucophage XL (glipizide product) 5mg  - one in morning and can take one in . Not to take more than that presently. Will order it that way for him.  Thanks,

## 2020-04-10 NOTE — Telephone Encounter (Signed)
Patient informed a new prescription was sent to his pharmacy for Glipizide two times daily. One tablet in the am and one in the pm. Patient verbalized understanding.

## 2020-07-03 ENCOUNTER — Ambulatory Visit: Payer: Medicare Other | Admitting: Internal Medicine

## 2020-10-16 ENCOUNTER — Other Ambulatory Visit: Payer: Self-pay | Admitting: Internal Medicine

## 2020-10-16 DIAGNOSIS — F17200 Nicotine dependence, unspecified, uncomplicated: Secondary | ICD-10-CM

## 2020-10-16 DIAGNOSIS — Z136 Encounter for screening for cardiovascular disorders: Secondary | ICD-10-CM

## 2020-11-12 ENCOUNTER — Telehealth: Payer: Self-pay | Admitting: *Deleted

## 2020-11-12 DIAGNOSIS — Z122 Encounter for screening for malignant neoplasm of respiratory organs: Secondary | ICD-10-CM

## 2020-11-12 DIAGNOSIS — Z87891 Personal history of nicotine dependence: Secondary | ICD-10-CM

## 2020-11-12 NOTE — Telephone Encounter (Signed)
Received referral for low dose lung cancer screening CT scan. Message left at phone number listed in EMR for patient to call me back to facilitate scheduling scan.  

## 2020-11-12 NOTE — Telephone Encounter (Signed)
Received referral for initial lung cancer screening scan. Contacted patient and obtained smoking history,(former, quit 2017, 45 pack year) as well as answering questions related to screening process. Patient denies signs of lung cancer such as weight loss or hemoptysis. Patient denies comorbidity that would prevent curative treatment if lung cancer were found. Patient is scheduled for shared decision making visit and CT scan on 11/27/20 at 2pm.

## 2020-11-27 ENCOUNTER — Other Ambulatory Visit: Payer: Self-pay

## 2020-11-27 ENCOUNTER — Ambulatory Visit
Admission: RE | Admit: 2020-11-27 | Discharge: 2020-11-27 | Disposition: A | Discharge status: Home / Self Care | Payer: Medicare Other | Source: Ambulatory Visit | Attending: Nurse Practitioner | Admitting: Nurse Practitioner

## 2020-11-27 ENCOUNTER — Encounter: Payer: Self-pay | Admitting: Nurse Practitioner

## 2020-11-27 ENCOUNTER — Inpatient Hospital Stay: Payer: Medicare Other | Attending: Nurse Practitioner | Admitting: Nurse Practitioner

## 2020-11-27 DIAGNOSIS — Z122 Encounter for screening for malignant neoplasm of respiratory organs: Secondary | ICD-10-CM

## 2020-11-27 DIAGNOSIS — Z87891 Personal history of nicotine dependence: Secondary | ICD-10-CM

## 2020-11-27 NOTE — Progress Notes (Signed)
Virtual Visit via Video Enabled Telemedicine Note   I connected with Gary Everett. on 11/27/20 at 2:00 PM EST by video enabled telemedicine visit and verified that I am speaking with the correct person using two identifiers.   I discussed the limitations, risks, security and privacy concerns of performing an evaluation and management service by telemedicine and the availability of in-person appointments. I also discussed with the patient that there may be a patient responsible charge related to this service. The patient expressed understanding and agreed to proceed.   Other persons participating in the visit and their role in the encounter: Burgess Estelle, RN- checking in patient & navigation  Patient's location: [place]  Provider's location: Clinic  Chief Complaint: Low Dose CT Screening  Patient agreed to evaluation by telemedicine to discuss shared decision making for consideration of low dose CT lung cancer screening.    In accordance with CMS guidelines, patient has met eligibility criteria including age, absence of signs or symptoms of lung cancer.  Social History   Tobacco Use  . Smoking status: Former Smoker    Packs/day: 1.00    Years: 45.00    Pack years: 45.00    Types: Cigarettes    Quit date: 2017    Years since quitting: 5.1  . Smokeless tobacco: Never Used  Substance Use Topics  . Alcohol use: No    Alcohol/week: 0.0 standard drinks    Comment: essentially none, very rarely     A shared decision-making session was conducted prior to the performance of CT scan. This includes one or more decision aids, includes benefits and harms of screening, follow-up diagnostic testing, over-diagnosis, false positive rate, and total radiation exposure.   Counseling on the importance of adherence to annual lung cancer LDCT screening, impact of co-morbidities, and ability or willingness to undergo diagnosis and treatment is imperative for compliance of the program.    Counseling on the importance of continued smoking cessation for former smokers; the importance of smoking cessation for current smokers, and information about tobacco cessation interventions have been given to patient including [place] and [address] [employer] [place] programs.   Written order for lung cancer screening with LDCT has been given to the patient and any and all questions have been answered to the best of my abilities.    Yearly follow up will be coordinated by Burgess Estelle, Thoracic Navigator.  I discussed the assessment and treatment plan with the patient. The patient was provided an opportunity to ask questions and all were answered. The patient agreed with the plan and demonstrated an understanding of the instructions.   The patient was advised to call back or seek an in-person evaluation if the symptoms worsen or if the condition fails to improve as anticipated.   I provided 15 minutes of face-to-face video visit time dedicated to the care of this patient on the date of this encounter to include pre-visit review of smoking history, face-to-face time with the patient, and post visit ordering of testing/documentation.   Beckey Rutter, DNP, AGNP-C [place] [facility] [phone] (clinic)

## 2020-12-05 ENCOUNTER — Encounter: Payer: Self-pay | Admitting: *Deleted

## 2021-10-22 LAB — HM HEPATITIS C SCREENING LAB: HM Hepatitis Screen: NEGATIVE

## 2021-12-27 ENCOUNTER — Telehealth: Payer: Self-pay | Admitting: *Deleted

## 2021-12-27 NOTE — Telephone Encounter (Signed)
LMTC to schedule Yearly Lung CA CT Scan. 

## 2022-04-03 DIAGNOSIS — I208 Other forms of angina pectoris: Secondary | ICD-10-CM | POA: Diagnosis not present

## 2022-04-24 DIAGNOSIS — E118 Type 2 diabetes mellitus with unspecified complications: Secondary | ICD-10-CM | POA: Diagnosis not present

## 2022-04-24 DIAGNOSIS — E782 Mixed hyperlipidemia: Secondary | ICD-10-CM | POA: Diagnosis not present

## 2022-06-04 ENCOUNTER — Other Ambulatory Visit: Payer: Self-pay

## 2022-06-04 ENCOUNTER — Telehealth: Payer: Self-pay

## 2022-06-04 DIAGNOSIS — Z1211 Encounter for screening for malignant neoplasm of colon: Secondary | ICD-10-CM

## 2022-06-04 MED ORDER — PEG 3350-KCL-NA BICARB-NACL 420 G PO SOLR: 4000.0000 mL | Freq: Once | ORAL | 0 refills | Status: AC

## 2022-06-04 NOTE — Telephone Encounter (Signed)
Gastroenterology Pre-Procedure Review  Request Date: 07/10/22 Requesting Physician: Dr. Marius Ditch  PATIENT REVIEW QUESTIONS: The patient responded to the following health history questions as indicated:    1. Are you having any GI issues? no 2. Do you have a personal history of Polyps? no 3. Do you have a family history of Colon Cancer or Polyps? no 4. Diabetes Mellitus? yes (patient has been advised to hold meformin) 5. Joint replacements in the past 12 months?no 6. Major health problems in the past 3 months?no 7. Any artificial heart valves, MVP, or defibrillator?no 8. Cardiac history: Angina pt see's Dr. Clayborn Bigness.  Clearance has been sent.     MEDICATIONS & ALLERGIES:    Patient reports the following regarding taking any anticoagulation/antiplatelet therapy:   Plavix, Coumadin, Eliquis, Xarelto, Lovenox, Pradaxa, Brilinta, or Effient? no Aspirin? no  Patient confirms/reports the following medications:  Current Outpatient Medications  Medication Sig Dispense Refill   ezetimibe (ZETIA) 10 MG tablet Take 1 tablet (10 mg total) by mouth daily. 90 tablet 3   fluticasone (FLONASE) 50 MCG/ACT nasal spray Place into both nostrils daily.     Garlic (ODOR FREE GARLIC) 031 MG TABS Take by mouth daily.       glipiZIDE (GLUCOTROL XL) 5 MG 24 hr tablet Take 1 tablet (5 mg total) by mouth 2 (two) times daily. 180 tablet 1   glucose blood (ONE TOUCH ULTRA TEST) test strip CHECK BLOOD SUGAR ONCE DAILY AND AS NEEDED (dx. DM E11.9) 100 each 1   meloxicam (MOBIC) 7.5 MG tablet Take 7.5 mg by mouth daily.     metFORMIN (GLUCOPHAGE) 1000 MG tablet Take 1,000 mg by mouth 2 (two) times daily with a meal.      ONETOUCH DELICA LANCETS 59Y MISC CHECK BLOOD SUGAR ONCE DAILY AND AS NEEDED (dx. DM E11.9) 100 each 1   No current facility-administered medications for this visit.    Patient confirms/reports the following allergies:  Allergies  Allergen Reactions   Contrast Media [Iodinated Contrast Media]      rash   Pravastatin     myalgias   Simvastatin     Other reaction(s): Other (See Comments) myalgias myalgias    No orders of the defined types were placed in this encounter.   AUTHORIZATION INFORMATION Primary Insurance: 1D#: Group #:  Secondary Insurance: 1D#: Group #:  SCHEDULE INFORMATION: Date: 07/10/22 Time: Location: [place]

## 2022-06-16 ENCOUNTER — Telehealth: Payer: Self-pay

## 2022-06-16 NOTE — Telephone Encounter (Signed)
Clearance has been granted for patients 07/10/22 Colonoscopy at [facility] per Dr. Marianna Payment.  Thanks,  [place], [place]

## 2022-07-02 ENCOUNTER — Other Ambulatory Visit: Payer: Self-pay

## 2022-07-02 ENCOUNTER — Encounter: Payer: Self-pay | Admitting: Gastroenterology

## 2022-07-10 ENCOUNTER — Ambulatory Visit: Payer: Medicare Other | Admitting: Anesthesiology

## 2022-07-10 ENCOUNTER — Encounter: Payer: Self-pay | Admitting: Gastroenterology

## 2022-07-10 ENCOUNTER — Encounter
Admission: RE | Disposition: A | Discharge status: Home / Self Care | Payer: Self-pay | Source: Home / Self Care | Attending: Gastroenterology

## 2022-07-10 ENCOUNTER — Ambulatory Visit
Admission: RE | Admit: 2022-07-10 | Discharge: 2022-07-10 | Disposition: A | Discharge status: Home / Self Care | Payer: Medicare Other | Attending: Gastroenterology | Admitting: Gastroenterology

## 2022-07-10 ENCOUNTER — Other Ambulatory Visit: Payer: Self-pay

## 2022-07-10 DIAGNOSIS — Z87891 Personal history of nicotine dependence: Secondary | ICD-10-CM | POA: Diagnosis not present

## 2022-07-10 DIAGNOSIS — K635 Polyp of colon: Secondary | ICD-10-CM

## 2022-07-10 DIAGNOSIS — Z1211 Encounter for screening for malignant neoplasm of colon: Secondary | ICD-10-CM | POA: Diagnosis not present

## 2022-07-10 DIAGNOSIS — D122 Benign neoplasm of ascending colon: Secondary | ICD-10-CM | POA: Insufficient documentation

## 2022-07-10 DIAGNOSIS — K5790 Diverticulosis of intestine, part unspecified, without perforation or abscess without bleeding: Secondary | ICD-10-CM | POA: Diagnosis not present

## 2022-07-10 DIAGNOSIS — E119 Type 2 diabetes mellitus without complications: Secondary | ICD-10-CM | POA: Diagnosis not present

## 2022-07-10 DIAGNOSIS — K5730 Diverticulosis of large intestine without perforation or abscess without bleeding: Secondary | ICD-10-CM | POA: Diagnosis not present

## 2022-07-10 HISTORY — PX: COLONOSCOPY WITH PROPOFOL: SHX5780

## 2022-07-10 HISTORY — DX: Family history of other specified conditions: Z84.89

## 2022-07-10 HISTORY — PX: POLYPECTOMY: SHX5525

## 2022-07-10 LAB — GLUCOSE, CAPILLARY: Glucose-Capillary: 154 mg/dL — ABNORMAL HIGH (ref 70–99)

## 2022-07-10 SURGERY — COLONOSCOPY WITH PROPOFOL
Anesthesia: General | Site: Rectum

## 2022-07-10 MED ORDER — STERILE WATER FOR IRRIGATION IR SOLN
Status: DC | PRN
  Administered 2022-07-10: 100 mL

## 2022-07-10 MED ORDER — PROPOFOL 10 MG/ML IV BOLUS
INTRAVENOUS | Status: DC | PRN
  Administered 2022-07-10: 25 mg via INTRAVENOUS
  Administered 2022-07-10: 100 mg via INTRAVENOUS
  Administered 2022-07-10: 50 mg via INTRAVENOUS

## 2022-07-10 MED ORDER — LACTATED RINGERS IV SOLN: INTRAVENOUS | Status: DC

## 2022-07-10 MED ORDER — SODIUM CHLORIDE 0.9 % IV SOLN: INTRAVENOUS | Status: DC

## 2022-07-10 SURGICAL SUPPLY — 8 items
GOWN CVR UNV OPN BCK APRN NK (MISCELLANEOUS) ×4 IMPLANT
GOWN ISOL THUMB LOOP REG UNIV (MISCELLANEOUS) ×4
KIT PRC NS LF DISP ENDO (KITS) ×2 IMPLANT
KIT PROCEDURE OLYMPUS (KITS) ×2
MANIFOLD NEPTUNE II (INSTRUMENTS) ×2 IMPLANT
SNARE COLD EXACTO (MISCELLANEOUS) IMPLANT
TRAP ETRAP POLY (MISCELLANEOUS) IMPLANT
WATER STERILE IRR 250ML POUR (IV SOLUTION) ×2 IMPLANT

## 2022-07-10 NOTE — Op Note (Signed)
[facility] Gastroenterology Patient Name: Gary Everett Procedure Date: 07/10/2022 7:21 AM MRN: [id number] Account #: [id number] Date of Birth: 1955/10/07 Admit Type: Outpatient Age: 66 Room: [facility] OR ROOM 01 Gender: Male Note Status: [place] Instrument Name: [id number] Procedure:             Colonoscopy Indications:           Screening for colorectal malignant neoplasm, Last                         colonoscopy 10 years ago Providers:             Lin Landsman MD, MD Medicines:             General Anesthesia Complications:         No immediate complications. Estimated blood loss: None. Procedure:             Pre-Anesthesia Assessment:                        - Prior to the procedure, a History and Physical was                         performed, and patient medications and allergies were                         reviewed. The patient is competent. The risks and                         benefits of the procedure and the sedation options and                         risks were discussed with the patient. All questions                         were answered and informed consent was obtained.                         Patient identification and proposed procedure were                         verified by the physician, the nurse, the                         anesthesiologist, the anesthetist and the technician                         in the pre-procedure area in the procedure room in the                         endoscopy suite. Mental Status Examination: alert and                         oriented. Airway Examination: normal oropharyngeal                         airway and neck mobility. Respiratory Examination:                         clear to auscultation. CV Examination:  normal.                         Prophylactic Antibiotics: The patient does not require                         prophylactic antibiotics. Prior Anticoagulants: The                         patient has taken no  previous anticoagulant or                         antiplatelet agents. ASA Grade Assessment: II - A                         patient with mild systemic disease. After reviewing                         the risks and benefits, the patient was deemed in                         satisfactory condition to undergo the procedure. The                         anesthesia plan was to use general anesthesia.                         Immediately prior to administration of medications,                         the patient was re-assessed for adequacy to receive                         sedatives. The heart rate, respiratory rate, oxygen                         saturations, blood pressure, adequacy of pulmonary                         ventilation, and response to care were monitored                         throughout the procedure. The physical status of the                         patient was re-assessed after the procedure.                        After obtaining informed consent, the colonoscope was                         passed under direct vision. Throughout the procedure,                         the patient's blood pressure, pulse, and oxygen                         saturations were monitored continuously. The  Colonoscope was introduced through the anus and                         advanced to the the cecum, identified by appendiceal                         orifice and ileocecal valve. The colonoscopy was                         performed without difficulty. The patient tolerated                         the procedure well. The quality of the bowel                         preparation was evaluated using the BBPS [facility] Bowel                         Preparation Scale) with scores of: Right Colon = 3,                         Transverse Colon = 3 and Left Colon = 3 (entire mucosa                         seen well with no residual staining, small fragments                         of  stool or opaque liquid). The total BBPS score                         equals 9. Findings:      The perianal and digital rectal examinations were normal. Pertinent       negatives include normal sphincter tone and no palpable rectal lesions.      A 4 mm polyp was found in the ascending colon. The polyp was sessile.       The polyp was removed with a cold snare. Resection and retrieval were       complete. Estimated blood loss: none.      Multiple diverticula were found in the recto-sigmoid colon and sigmoid       colon.      The retroflexed view of the distal rectum and anal verge was normal and       showed no anal or rectal abnormalities.      The exam was otherwise without abnormality. Impression:            - One 4 mm polyp in the ascending colon, removed with                         a cold snare. Resected and retrieved.                        - Diverticulosis in the recto-sigmoid colon and in the                         sigmoid colon.                        -  The distal rectum and anal verge are normal on                         retroflexion view.                        - The examination was otherwise normal. Recommendation:        - Discharge patient to home (with escort).                        - Resume previous diet today.                        - Continue present medications.                        - Await pathology results.                        - Repeat colonoscopy in 7-10 years for surveillance                         based on pathology results. Procedure Code(s):     --- Professional ---                        [id number], Colonoscopy, flexible; with removal of                         tumor(s), polyp(s), or other lesion(s) by snare                         technique Diagnosis Code(s):     --- Professional ---                        Z12.11, Encounter for screening for malignant neoplasm                         of colon                        K63.5, Polyp of colon                         K57.30, Diverticulosis of large intestine without                         perforation or abscess without bleeding CPT copyright 2019 American Medical Association. All rights reserved. The codes documented in this report are preliminary and upon coder review may  be revised to meet current compliance requirements. Dr. Ulyess Mort Lin Landsman MD, MD 07/10/2022 8:01:00 AM This report has been signed electronically. Number of Addenda: 0 Note Initiated On: 07/10/2022 7:21 AM Scope Withdrawal Time: 0 hours 10 minutes 2 seconds  Total Procedure Duration: 0 hours 13 minutes 11 seconds  Estimated Blood Loss:  Estimated blood loss: none.      [facility]

## 2022-07-10 NOTE — H&P (Signed)
Gary Darby, MD [address]  [place]  [place], [place] [address]  Main: [phone]  Fax: [phone] Pager: [phone]  Primary Care Physician:  Macarthur Critchley, MD Primary Gastroenterologist:  Dr. Cephas Everett  Pre-Procedure History & Physical: HPI:  Gary Mastel. is a 66 y.o. male is here for an colonoscopy.   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 Carloyn Manner   EXTRACORPOREAL SHOCK WAVE LITHOTRIPSY Right 12/18/2016   Procedure: EXTRACORPOREAL SHOCK WAVE LITHOTRIPSY (ESWL);  Surgeon: Hollice Espy, MD;  Location: [facility] ORS;  Service: Urology;  Laterality: Right;   FOOT SURGERY  2009   KIDNEY STONE SURGERY  1979-present   NECK SURGERY     SEPTOPLASTY  1999   [place]    Prior to Admission medications   Medication Sig Start Date End Date Taking? Authorizing Provider  Empagliflozin-linaGLIPtin (GLYXAMBI PO) Take by mouth.   Yes [provider]  fluticasone (FLONASE) 50 MCG/ACT nasal spray Place into both nostrils daily.   Yes [provider]  glipiZIDE (GLUCOTROL XL) 5 MG 24 hr tablet Take 1 tablet (5 mg total) by mouth 2 (two) times daily. 04/09/20 07/10/22 Yes Towanda Malkin, MD  metFORMIN (GLUCOPHAGE) 1000 MG tablet Take 1,000 mg by mouth 2 (two) times daily with a meal.  12/01/16  Yes [provider]  ezetimibe (ZETIA) 10 MG tablet Take 1 tablet (10 mg total) by mouth daily. Patient not taking: Reported on 07/02/2022 03/01/20   Towanda Malkin, MD  Garlic (ODOR FREE GARLIC)  177 MG TABS Take by mouth daily.   Patient not taking: Reported on 07/02/2022    [provider]  glucose blood (ONE TOUCH ULTRA TEST) test strip CHECK BLOOD SUGAR ONCE DAILY AND AS NEEDED (dx. DM E11.9) 06/28/15   Tower, Wynelle Fanny, MD  meloxicam (MOBIC) 7.5 MG tablet Take 7.5 mg by mouth daily. Patient not taking: Reported on 07/02/2022 12/13/19   [provider]  Jonetta Speak LANCETS 93J MISC CHECK BLOOD SUGAR ONCE DAILY AND AS NEEDED (dx. DM E11.9) 06/28/15   Tower, Wynelle Fanny, MD  pravastatin (PRAVACHOL) 40 MG tablet Take 1 tablet (40 mg total) by mouth every evening. 07/02/11 12/04/11  Modesto Charon, MD    Allergies as of 06/04/2022 - Review Complete 02/29/2020  Allergen Reaction Noted   Contrast media [iodinated contrast media]  12/04/2011   Pravastatin  12/04/2011   Simvastatin  12/04/2011    Family History  Problem Relation Age of Onset   Diabetes Mother    Kidney disease Mother        Renal Insuff   Heart disease Mother        CAD   COPD Mother    Peripheral vascular disease Mother    Hypertension Brother    Lupus Sister    COPD Sister    Mental illness Sister        Bipolar   Kidney Stones Father  Prostate cancer Paternal Uncle    Colon cancer Neg Hx    Hematuria Neg Hx    Renal cancer Neg Hx    Sickle cell anemia Neg Hx     Social History   Socioeconomic History   Marital status: Married    Spouse name: Not on file   Number of children: 1   Years of education: Not on file   Highest education level: Not on file  Occupational History   Occupation: POLICE OFFICER    Employer: [employer]    Comment: [place]  Tobacco Use   Smoking status: Former    Packs/day: 1.00    Years: 45.00    Total pack years: 45.00    Types: Cigarettes    Quit date: 2017    Years since quitting: 6.8   Smokeless tobacco: Never  Substance and Sexual Activity   Alcohol use: Yes    Alcohol/week: 7.0 standard drinks of alcohol    Types: 7 Cans of  beer per week   Drug use: No   Sexual activity: Never  Other Topics Concern   Not on file  Social History Narrative   Retired from [employer], part-time Psychologist, counselling and Rec work as of 2016   Remarried 2003   1 daughter from [employer] relationship   Social Determinants of Radio broadcast assistant Strain: Not on [employer] Insecurity: Not on file  Transportation Needs: Not on file  Physical Activity: Not on file  Stress: Not on file  Social Connections: Not on file  Intimate Partner Violence: Not on file    Review of Systems: See HPI, otherwise negative ROS  Physical Exam: BP 122/82   Pulse 92   Temp 97.6 F (36.4 C) (Temporal)   Resp 18   Ht '5\' 7"'$  (1.702 m)   Wt 69.9 kg   SpO2 99%   BMI 24.12 kg/m  General:   Alert,  pleasant and cooperative in NAD Head:  Normocephalic and atraumatic. Neck:  Supple; no masses or thyromegaly. Lungs:  Clear throughout to auscultation.    Heart:  Regular rate and rhythm. Abdomen:  Soft, nontender and nondistended. Normal bowel sounds, without guarding, and without rebound.   Neurologic:  Alert and  oriented x4;  grossly normal neurologically.  Impression/Plan: Gary Cruel. is here for an colonoscopy to be performed for colon cancer screening  Risks, benefits, limitations, and alternatives regarding  colonoscopy have been reviewed with the patient.  Questions have been answered.  All parties agreeable.   Sherri Sear, MD  07/10/2022, 7:31 AM

## 2022-07-10 NOTE — Anesthesia Postprocedure Evaluation (Signed)
Anesthesia Post Note  Patient: Gary Everett.  Procedure(s) Performed: COLONOSCOPY WITH PROPOFOL (Rectum) POLYPECTOMY (Rectum)  Patient location during evaluation: PACU Anesthesia Type: General Level of consciousness: awake and alert Pain management: pain level controlled Vital Signs Assessment: post-procedure vital signs reviewed and stable Respiratory status: spontaneous breathing, nonlabored ventilation, respiratory function stable and patient connected to nasal cannula oxygen Cardiovascular status: blood pressure returned to baseline and stable Postop Assessment: no apparent nausea or vomiting Anesthetic complications: no   No notable events documented.   Last Vitals:  Vitals:   07/10/22 0802 07/10/22 0814  BP: 107/74 106/68  Pulse: 94 95  Resp: 18 (!) 25  Temp: (!) 36.1 C (!) 36.2 C  SpO2: 96% 96%    Last Pain:  Vitals:   07/10/22 0802  TempSrc:   PainSc: 0-No pain                 Martha Clan

## 2022-07-10 NOTE — Anesthesia Preprocedure Evaluation (Signed)
Anesthesia Evaluation  Patient identified by MRN, date of birth, ID band Patient awake    Reviewed: Allergy & Precautions, H&P , NPO status , Patient's Chart, lab work & pertinent test results, reviewed documented beta blocker date and time   History of Anesthesia Complications Negative for: history of anesthetic complications  Airway Mallampati: II  TM Distance: >3 FB Neck ROM: full    Dental  (+) Dental Advidsory Given, Caps, Teeth Intact   Pulmonary neg pulmonary ROS, former smoker,    Pulmonary exam normal breath sounds clear to auscultation       Cardiovascular Exercise Tolerance: Good negative cardio ROS Normal cardiovascular exam Rhythm:regular Rate:Normal     Neuro/Psych negative neurological ROS  negative psych ROS   GI/Hepatic negative GI ROS, Neg liver ROS,   Endo/Other  diabetes  Renal/GU Renal disease (kidney stones)  negative genitourinary   Musculoskeletal   Abdominal   Peds  Hematology negative hematology ROS (+)   Anesthesia Other Findings Past Medical History: 09/22/2012: Colitis 08/22/2004: Diabetes mellitus, type 2 ([facility]) No date: DJD (degenerative disc disease) of cervical spine No date: 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 No date: Family history of adverse reaction to anesthesia 05/23/2004: Hyperlipemia     Comment:  statin intolerant No date: Inguinal hernia No date: Kidney stones No date: OA (osteoarthritis)     Comment:  of hands   Reproductive/Obstetrics negative OB ROS                             Anesthesia Physical Anesthesia Plan  ASA: 2  Anesthesia Plan: General   Post-op Pain Management:    Induction: Intravenous  PONV Risk Score and Plan: 2 and Propofol infusion and TIVA  Airway Management Planned: Natural Airway and Simple Face Mask  Additional Equipment:   Intra-op Plan:   Post-operative Plan:   Informed Consent: I  have reviewed the patients History and Physical, chart, labs and discussed the procedure including the risks, benefits and alternatives for the proposed anesthesia with the patient or authorized representative who has indicated his/her understanding and acceptance.     Dental Advisory Given  Plan Discussed with: Anesthesiologist, CRNA and Surgeon  Anesthesia Plan Comments:         Anesthesia Quick Evaluation

## 2022-07-10 NOTE — Transfer of Care (Signed)
Immediate Anesthesia Transfer of Care Note  Patient: Gary Everett.  Procedure(s) Performed: COLONOSCOPY WITH PROPOFOL (Rectum) POLYPECTOMY (Rectum)  Patient Location: PACU  Anesthesia Type: MAC  Level of Consciousness: awake, alert  and patient cooperative  Airway and Oxygen Therapy: Patient Spontanous Breathing and Patient connected to supplemental oxygen  Post-op Assessment: Post-op Vital signs reviewed, Patient's Cardiovascular Status Stable, Respiratory Function Stable, Patent Airway and No signs of Nausea or vomiting  Post-op Vital Signs: Reviewed and stable  Complications: No notable events documented.

## 2022-07-11 ENCOUNTER — Encounter: Payer: Self-pay | Admitting: Gastroenterology

## 2022-07-14 ENCOUNTER — Encounter: Payer: Self-pay | Admitting: Gastroenterology

## 2022-07-14 LAB — SURGICAL PATHOLOGY

## 2022-07-15 DIAGNOSIS — E113393 Type 2 diabetes mellitus with moderate nonproliferative diabetic retinopathy without macular edema, bilateral: Secondary | ICD-10-CM | POA: Diagnosis not present

## 2022-07-24 DIAGNOSIS — G510 Bell's palsy: Secondary | ICD-10-CM | POA: Diagnosis not present

## 2022-07-24 DIAGNOSIS — R413 Other amnesia: Secondary | ICD-10-CM | POA: Diagnosis not present

## 2022-07-24 DIAGNOSIS — I10 Essential (primary) hypertension: Secondary | ICD-10-CM | POA: Diagnosis not present

## 2022-07-24 DIAGNOSIS — E118 Type 2 diabetes mellitus with unspecified complications: Secondary | ICD-10-CM | POA: Diagnosis not present

## 2022-07-24 DIAGNOSIS — R972 Elevated prostate specific antigen [PSA]: Secondary | ICD-10-CM | POA: Diagnosis not present

## 2022-07-24 DIAGNOSIS — E782 Mixed hyperlipidemia: Secondary | ICD-10-CM | POA: Diagnosis not present

## 2022-10-24 DIAGNOSIS — Z6831 Body mass index (BMI) 31.0-31.9, adult: Secondary | ICD-10-CM | POA: Diagnosis not present

## 2022-10-24 DIAGNOSIS — E782 Mixed hyperlipidemia: Secondary | ICD-10-CM | POA: Diagnosis not present

## 2022-10-24 DIAGNOSIS — Z1211 Encounter for screening for malignant neoplasm of colon: Secondary | ICD-10-CM | POA: Diagnosis not present

## 2022-10-24 DIAGNOSIS — E118 Type 2 diabetes mellitus with unspecified complications: Secondary | ICD-10-CM | POA: Diagnosis not present

## 2023-01-22 DIAGNOSIS — R413 Other amnesia: Secondary | ICD-10-CM | POA: Diagnosis not present

## 2023-01-22 DIAGNOSIS — Z6825 Body mass index (BMI) 25.0-25.9, adult: Secondary | ICD-10-CM | POA: Diagnosis not present

## 2023-01-22 DIAGNOSIS — E118 Type 2 diabetes mellitus with unspecified complications: Secondary | ICD-10-CM | POA: Diagnosis not present

## 2023-02-19 DIAGNOSIS — H5213 Myopia, bilateral: Secondary | ICD-10-CM | POA: Diagnosis not present

## 2023-03-11 DIAGNOSIS — H9190 Unspecified hearing loss, unspecified ear: Secondary | ICD-10-CM | POA: Diagnosis not present

## 2023-04-27 DIAGNOSIS — E782 Mixed hyperlipidemia: Secondary | ICD-10-CM | POA: Diagnosis not present

## 2023-04-27 DIAGNOSIS — Z6826 Body mass index (BMI) 26.0-26.9, adult: Secondary | ICD-10-CM | POA: Diagnosis not present

## 2023-04-27 DIAGNOSIS — E118 Type 2 diabetes mellitus with unspecified complications: Secondary | ICD-10-CM | POA: Diagnosis not present

## 2023-04-27 DIAGNOSIS — E559 Vitamin D deficiency, unspecified: Secondary | ICD-10-CM | POA: Diagnosis not present

## 2023-05-24 DIAGNOSIS — E119 Type 2 diabetes mellitus without complications: Secondary | ICD-10-CM | POA: Diagnosis not present

## 2023-06-23 DIAGNOSIS — E119 Type 2 diabetes mellitus without complications: Secondary | ICD-10-CM | POA: Diagnosis not present

## 2023-07-24 DIAGNOSIS — E119 Type 2 diabetes mellitus without complications: Secondary | ICD-10-CM | POA: Diagnosis not present

## 2023-08-23 DIAGNOSIS — E119 Type 2 diabetes mellitus without complications: Secondary | ICD-10-CM | POA: Diagnosis not present

## 2023-09-23 DIAGNOSIS — E119 Type 2 diabetes mellitus without complications: Secondary | ICD-10-CM | POA: Diagnosis not present

## 2023-10-24 DIAGNOSIS — E119 Type 2 diabetes mellitus without complications: Secondary | ICD-10-CM | POA: Diagnosis not present

## 2023-10-26 ENCOUNTER — Encounter: Payer: Self-pay | Admitting: Internal Medicine

## 2023-10-26 ENCOUNTER — Ambulatory Visit (INDEPENDENT_AMBULATORY_CARE_PROVIDER_SITE_OTHER): Payer: Medicare Other | Admitting: Internal Medicine

## 2023-10-26 ENCOUNTER — Other Ambulatory Visit: Payer: Self-pay

## 2023-10-26 VITALS — BP 130/72 | HR 94 | Temp 98.3°F | Resp 16 | Ht 66.0 in | Wt 159.7 lb

## 2023-10-26 DIAGNOSIS — Z13220 Encounter for screening for lipoid disorders: Secondary | ICD-10-CM

## 2023-10-26 DIAGNOSIS — T466X5A Adverse effect of antihyperlipidemic and antiarteriosclerotic drugs, initial encounter: Secondary | ICD-10-CM

## 2023-10-26 DIAGNOSIS — Z125 Encounter for screening for malignant neoplasm of prostate: Secondary | ICD-10-CM | POA: Diagnosis not present

## 2023-10-26 DIAGNOSIS — Z7984 Long term (current) use of oral hypoglycemic drugs: Secondary | ICD-10-CM | POA: Diagnosis not present

## 2023-10-26 DIAGNOSIS — M791 Myalgia, unspecified site: Secondary | ICD-10-CM | POA: Diagnosis not present

## 2023-10-26 DIAGNOSIS — E1165 Type 2 diabetes mellitus with hyperglycemia: Secondary | ICD-10-CM | POA: Insufficient documentation

## 2023-10-26 DIAGNOSIS — Z23 Encounter for immunization: Secondary | ICD-10-CM

## 2023-10-26 MED ORDER — GLIPIZIDE ER 5 MG PO TB24: 5.0000 mg | ORAL_TABLET | Freq: Two times a day (BID) | ORAL | 1 refills | Status: DC

## 2023-10-26 MED ORDER — METFORMIN HCL 1000 MG PO TABS: 1000.0000 mg | ORAL_TABLET | Freq: Two times a day (BID) | ORAL | 1 refills | Status: DC

## 2023-10-26 NOTE — Assessment & Plan Note (Addendum)
Patient had been on multiple types of statins years ago and cannot tolerate any of them due to myalgias.

## 2023-10-26 NOTE — Assessment & Plan Note (Signed)
Recheck A1c, microalbumin and foot exam today.  For now we will continue metformin at 1000 mg twice daily and glipizide 5 mg twice daily.  Did briefly discuss starting something like Mounjaro if his insurance will cover.  Will wait for labs prior to prescribing medication.

## 2023-10-26 NOTE — Progress Notes (Signed)
New Patient Office Visit  Subjective    Patient ID: Gary Everett., male    DOB: Nov 19, 1955  Age: 68 y.o. MRN: [id number]  CC:  Chief Complaint  Patient presents with   Establish Care    HPI Gary Everett. presents to establish care. Had been a patient here years ago.   HLD: -Medications: Nothing, history of statin intolerance - could not tolerate Simvastatin, Pravastatin due to myalgias  -Last lipid panel: Lipid Panel     Component Value Date/Time   CHOL 211 (H) 02/29/2020 0833   CHOL 221 05/17/2013 0000   TRIG 103 02/29/2020 0833   TRIG 126 05/17/2013 0000   HDL 46 02/29/2020 0833   CHOLHDL 4.6 02/29/2020 0833   VLDL 17.8 01/18/2015 0829   LDLCALC 143 (H) 02/29/2020 0833   LDLCALC 158 05/17/2013 0000   Diabetes, Type 2: -Last A1c 8.7% 1/23 - was in the lower 8's over the summer  -Medications: Metformin 1000 mg BID, Glipizide 5 mg BID -Had been on Farxiga but was too expensive, had also been on Empagliflozin-linaGliptin in the past but had kidney issues with it -Patient is compliant with the above medications and reports no side effects.  -Checking BG at home fasting: 197-208 -Exercise: walking -Eye exam: Uncertain  -Foot exam: Due today -Microalbumin: Due today -Statin: No cannot tolerate  -PNA vaccine: declines all vaccines  -Denies symptoms of hypoglycemia, polyuria, polydipsia, numbness extremities, foot ulcers/trauma.   Health Maintenance: -Blood work due  -Colon cancer: colonoscopy 10/23, repeat in 7-10 years  -Declines all vaccines   Outpatient Encounter Medications as of 10/26/2023  Medication Sig   fluticasone (FLONASE) 50 MCG/ACT nasal spray Place into both nostrils daily.   [DISCONTINUED] glipiZIDE (GLUCOTROL XL) 5 MG 24 hr tablet Take 1 tablet (5 mg total) by mouth 2 (two) times daily.   [DISCONTINUED] metFORMIN (GLUCOPHAGE) 1000 MG tablet Take 1,000 mg by mouth 2 (two) times daily with a meal.    glipiZIDE (GLUCOTROL XL) 5 MG 24 hr tablet Take 1  tablet (5 mg total) by mouth 2 (two) times daily.   glucose blood (ONE TOUCH ULTRA TEST) test strip CHECK BLOOD SUGAR ONCE DAILY AND AS NEEDED (dx. DM E11.9) (Patient not taking: Reported on 10/26/2023)   metFORMIN (GLUCOPHAGE) 1000 MG tablet Take 1 tablet (1,000 mg total) by mouth 2 (two) times daily with a meal.   ONETOUCH DELICA LANCETS 33G MISC CHECK BLOOD SUGAR ONCE DAILY AND AS NEEDED (dx. DM E11.9) (Patient not taking: Reported on 10/26/2023)   [DISCONTINUED] Empagliflozin-linaGLIPtin (GLYXAMBI PO) Take by mouth. (Patient not taking: Reported on 10/26/2023)   [DISCONTINUED] FARXIGA 10 MG TABS tablet Take 10 mg by mouth every morning. (Patient not taking: Reported on 10/26/2023)   [DISCONTINUED] pravastatin (PRAVACHOL) 40 MG tablet Take 1 tablet (40 mg total) by mouth every evening.   No facility-administered encounter medications on file as of 10/26/2023.    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 Channing Mutters   COLONOSCOPY WITH PROPOFOL N/A 07/10/2022   Procedure: COLONOSCOPY WITH PROPOFOL;  Surgeon: Toney Reil, MD;  Location: [facility];  Service: Endoscopy;  Laterality: N/A;   EXTRACORPOREAL SHOCK WAVE LITHOTRIPSY Right 12/18/2016   Procedure: EXTRACORPOREAL SHOCK WAVE LITHOTRIPSY (ESWL);  Surgeon: Vanna Scotland, MD;  Location: [facility];  Service: Urology;  Laterality: Right;   FOOT SURGERY  2009   KIDNEY STONE SURGERY  1979-present   NECK SURGERY     POLYPECTOMY  07/10/2022   Procedure: POLYPECTOMY;  Surgeon: Toney Reil, MD;   Location: [facility];  Service: Endoscopy;;   SEPTOPLASTY  1999   [place]    Family History  Problem Relation Age of Onset   Diabetes Mother    Kidney disease Mother        Renal Insuff   Heart disease Mother        CAD   COPD Mother    Peripheral vascular disease Mother    Hypertension Brother    Lupus Sister    COPD Sister    Mental illness Sister        Bipolar   Kidney Stones Father    Prostate cancer Paternal Uncle    Colon cancer Neg Hx    Hematuria Neg Hx    Renal cancer Neg Hx    Sickle cell anemia Neg Hx     Social History   Socioeconomic History   Marital status: Married    Spouse name: Not on file   Number of children: 1   Years of education: Not on file   Highest education level: Not on file  Occupational History   Occupation: POLICE OFFICER    Employer: [employer]    Comment: [place]  Tobacco Use   Smoking status: Former    Current packs/day: 0.00    Average packs/day: 1 pack/day for 45.0 years (45.0 ttl pk-yrs)    Types: Cigarettes    Start date: 82    Quit date: 2017    Years since quitting: 8.0   Smokeless tobacco: Never  Substance and Sexual Activity   Alcohol use: Yes    Alcohol/week: 7.0 standard drinks of alcohol    Types: 7 Cans of beer per week   Drug use: No   Sexual activity: Never  Other Topics Concern   Not on file  Social History Narrative   Retired from [employer], part-time Water quality scientist and Rec work as of 2016   Remarried 2003   1 daughter from [employer] relationship   Social Drivers of Corporate investment banker Strain: Not on [employer] Insecurity: Not on file  Transportation Needs: Not on file  Physical Activity: Not on file  Stress: Not on file  Social Connections: Not on file  Intimate Partner Violence: Not on file    Review of Systems  All other systems reviewed and are negative.       Objective    BP 130/72 (Cuff Size: Normal)   Pulse 94   Temp 98.3 F (36.8 C)  (Oral)   Resp 16   Ht 5\' 6"  (1.676 m)   Wt 159 lb 11.2 oz (72.4 kg)   SpO2 97%   BMI 25.78 kg/m   Physical Exam Constitutional:      Appearance: Normal appearance.  HENT:     Head: Normocephalic and atraumatic.  Eyes:     Conjunctiva/sclera: Conjunctivae normal.  Cardiovascular:     Rate and Rhythm:  Normal rate and regular rhythm.     Pulses:          Dorsalis pedis pulses are 2+ on the right side and 2+ on the left side.  Pulmonary:     Effort: Pulmonary effort is normal.     Breath sounds: Normal breath sounds.  Musculoskeletal:     Right foot: Normal range of motion. No deformity, bunion, Charcot foot, foot drop or prominent metatarsal heads.     Left foot: Normal range of motion. No deformity, bunion, Charcot foot, foot drop or prominent metatarsal heads.  Feet:     Right foot:     Protective Sensation: 6 sites tested.  6 sites sensed.     Skin integrity: Skin integrity normal.     Toenail Condition: Right toenails are normal.     Left foot:     Protective Sensation: 6 sites tested.  6 sites sensed.     Skin integrity: Skin integrity normal.     Toenail Condition: Left toenails are normal.  Skin:    General: Skin is warm and dry.  Neurological:     General: No focal deficit present.     Mental Status: He is alert. Mental status is at baseline.  Psychiatric:        Mood and Affect: Mood normal.        Behavior: Behavior normal.         Assessment & Plan:  Type 2 diabetes mellitus with hyperglycemia, without long-term current use of insulin (HCC) Assessment & Plan: Recheck A1c, microalbumin and foot exam today.  For now we will continue metformin at 1000 mg twice daily and glipizide 5 mg twice daily.  Did briefly discuss starting something like Mounjaro if his insurance will cover.  Will wait for labs prior to prescribing medication.  Orders: -     CBC with Differential/Platelet -     COMPLETE METABOLIC PANEL WITH GFR -     Hemoglobin A1c -     Microalbumin  / creatinine urine ratio -     HM Diabetes Foot Exam -     glipiZIDE ER; Take 1 tablet (5 mg total) by mouth 2 (two) times daily.  Dispense: 180 tablet; Refill: 1 -     metFORMIN HCl; Take 1 tablet (1,000 mg total) by mouth 2 (two) times daily with a meal.  Dispense: 180 tablet; Refill: 1  Lipid screening -     Lipid panel  Myalgia due to statin Assessment & Plan: Patient had been on multiple types of statins years ago and cannot tolerate any of them due to myalgias.   Need for shingles vaccine  Prostate cancer screening -     PSA  Screening labs ordered, patient can get shingles vaccine at the pharmacy but declines all other vaccines.   Return in about 3 months (around 01/23/2024).   Margarita Mail, DO

## 2023-10-27 LAB — CBC WITH DIFFERENTIAL/PLATELET
Absolute Lymphocytes: 1203 {cells}/uL (ref 850–3900)
Absolute Monocytes: 432 {cells}/uL (ref 200–950)
Basophils Absolute: 61 {cells}/uL (ref 0–200)
Basophils Relative: 1.3 %
Eosinophils Absolute: 150 {cells}/uL (ref 15–500)
Eosinophils Relative: 3.2 %
HCT: 43.8 % (ref 38.5–50.0)
Hemoglobin: 14.6 g/dL (ref 13.2–17.1)
MCH: 30.2 pg (ref 27.0–33.0)
MCHC: 33.3 g/dL (ref 32.0–36.0)
MCV: 90.5 fL (ref 80.0–100.0)
MPV: 9.2 fL (ref 7.5–12.5)
Monocytes Relative: 9.2 %
Neutro Abs: 2853 {cells}/uL (ref 1500–7800)
Neutrophils Relative %: 60.7 %
Platelets: 280 10*3/uL (ref 140–400)
RBC: 4.84 10*6/uL (ref 4.20–5.80)
RDW: 12.3 % (ref 11.0–15.0)
Total Lymphocyte: 25.6 %
WBC: 4.7 10*3/uL (ref 3.8–10.8)

## 2023-10-27 LAB — MICROALBUMIN / CREATININE URINE RATIO
Creatinine, Urine: 49 mg/dL (ref 20–320)
Microalb Creat Ratio: 4 mg/g{creat} (ref <30)
Microalb, Ur: 0.2 mg/dL

## 2023-10-27 LAB — COMPLETE METABOLIC PANEL WITH GFR
AG Ratio: 2.2 (calc) (ref 1.0–2.5)
ALT: 15 U/L (ref 9–46)
AST: 11 U/L (ref 10–35)
Albumin: 4.4 g/dL (ref 3.6–5.1)
Alkaline phosphatase (APISO): 54 U/L (ref 35–144)
BUN: 13 mg/dL (ref 7–25)
CO2: 25 mmol/L (ref 20–32)
Calcium: 9.5 mg/dL (ref 8.6–10.3)
Chloride: 100 mmol/L (ref 98–110)
Creat: 0.89 mg/dL (ref 0.70–1.35)
Globulin: 2 g/dL (ref 1.9–3.7)
Glucose, Bld: 284 mg/dL — ABNORMAL HIGH (ref 65–99)
Potassium: 4.3 mmol/L (ref 3.5–5.3)
Sodium: 135 mmol/L (ref 135–146)
Total Bilirubin: 0.4 mg/dL (ref 0.2–1.2)
Total Protein: 6.4 g/dL (ref 6.1–8.1)
eGFR: 93 mL/min/{1.73_m2} (ref >=60)

## 2023-10-27 LAB — HEMOGLOBIN A1C
Hgb A1c MFr Bld: 9.1 %{Hb} — ABNORMAL HIGH (ref <5.7)
Mean Plasma Glucose: 214 mg/dL
eAG (mmol/L): 11.9 mmol/L

## 2023-10-27 LAB — PSA: PSA: 0.22 ng/mL (ref <=4.00)

## 2023-10-28 MED ORDER — TIRZEPATIDE 2.5 MG/0.5ML ~~LOC~~ SOAJ: 2.5000 mg | SUBCUTANEOUS | 0 refills | Status: DC

## 2023-11-21 DIAGNOSIS — E119 Type 2 diabetes mellitus without complications: Secondary | ICD-10-CM | POA: Diagnosis not present

## 2023-11-26 ENCOUNTER — Other Ambulatory Visit: Payer: Self-pay | Admitting: Internal Medicine

## 2023-11-26 DIAGNOSIS — E1165 Type 2 diabetes mellitus with hyperglycemia: Secondary | ICD-10-CM

## 2023-11-26 NOTE — Telephone Encounter (Signed)
 Requested medication (s) are due for refill today: Yes  Requested medication (s) are on the active medication list: Yes  Last refill:  10/28/23  Future visit scheduled: Yes  Notes to clinic:  Unable to refill due to no refill protocol for this medication.      Requested Prescriptions  Pending Prescriptions Disp Refills   MOUNJARO 2.5 MG/0.5ML Pen [Pharmacy Med Name: MOUNJARO 2.5 MG/0.5 ML PEN]      Sig: INJECT 2.5 MG SUBCUTANEOUSLY WEEKLY     Off-Protocol Failed - 11/26/2023  5:21 PM      Failed - Medication not assigned to a protocol, review manually.      Failed - Valid encounter within last 12 months    Recent Outpatient Visits           3 years ago Diabetes mellitus without complication [facility])   [facility] Jamelle Haring, MD   3 years ago Encounter to establish care with new doctor   [facility] Jamelle Haring, MD       Future Appointments             In 2 months Margarita Mail, DO [facility]

## 2023-11-26 NOTE — Telephone Encounter (Signed)
 Copied from CRM [id number]. Topic: Clinical - Medication Refill >> Nov 26, 2023  4:29 PM Truddie Crumble wrote: Most Recent Primary Care Visit:  Provider: Margarita Mail  Department: [facility]  Visit Type: NEW PATIENT  Date: 10/26/2023  Medication: tirzepatide [facility]) 2.5 MG/0.5ML Pen  Has the patient contacted their pharmacy? Yes (Agent: If no, request that the patient contact the pharmacy for the refill. If patient does not wish to contact the pharmacy document the reason why and proceed with request.) (Agent: If yes, when and what did the pharmacy advise?)  Is this the correct pharmacy for this prescription? Yes If no, delete pharmacy and type the correct one.  This is the patient's preferred pharmacy:   [id number] - [place], [place] - [address] [place] [address] Phone: [phone] Fax: [phone]   Has the prescription been filled recently? Yes  Is the patient out of the medication? Yes  Has the patient been seen for an appointment in the last year OR does the patient have an upcoming appointment? Yes  Can we respond through MyChart? No  Agent: Please be advised that Rx refills may take up to 3 business days. We ask that you follow-up with your pharmacy.

## 2023-11-26 NOTE — Telephone Encounter (Signed)
 Last Fill: 10/28/23 2 mL/0 RF  Last OV: 10/26/23 Next OV: 01/29/24  Routing to provider for review/authorization.   Copied from CRM [id number]. Topic: Clinical - Medication Refill >> Nov 26, 2023  4:29 PM Truddie Crumble wrote: Most Recent Primary Care Visit:  Provider: Margarita Mail  Department: [facility]  Visit Type: NEW PATIENT  Date: 10/26/2023  Medication: tirzepatide [facility]) 2.5 MG/0.5ML Pen  Has the patient contacted their pharmacy? Yes (Agent: If no, request that the patient contact the pharmacy for the refill. If patient does not wish to contact the pharmacy document the reason why and proceed with request.) (Agent: If yes, when and what did the pharmacy advise?)  Is this the correct pharmacy for this prescription? Yes If no, delete pharmacy and type the correct one.  This is the patient's preferred pharmacy:   [id number] - [place], [place] - [address] [place] [address] Phone: [phone] Fax: [phone]   Has the prescription been filled recently? Yes  Is the patient out of the medication? Yes  Has the patient been seen for an appointment in the last year OR does the patient have an upcoming appointment? Yes  Can we respond through MyChart? No  Agent: Please be advised that Rx refills may take up to 3 business days. We ask that you follow-up with your pharmacy.

## 2023-12-22 DIAGNOSIS — E119 Type 2 diabetes mellitus without complications: Secondary | ICD-10-CM | POA: Diagnosis not present

## 2024-01-21 DIAGNOSIS — E119 Type 2 diabetes mellitus without complications: Secondary | ICD-10-CM | POA: Diagnosis not present

## 2024-01-29 ENCOUNTER — Ambulatory Visit: Payer: Medicare Other | Admitting: Internal Medicine

## 2024-01-29 ENCOUNTER — Encounter: Payer: Self-pay | Admitting: Internal Medicine

## 2024-01-29 ENCOUNTER — Other Ambulatory Visit: Payer: Self-pay

## 2024-01-29 VITALS — BP 124/82 | HR 93 | Temp 97.8°F | Resp 16 | Ht 66.0 in | Wt 152.8 lb

## 2024-01-29 DIAGNOSIS — Z7984 Long term (current) use of oral hypoglycemic drugs: Secondary | ICD-10-CM | POA: Diagnosis not present

## 2024-01-29 DIAGNOSIS — E1165 Type 2 diabetes mellitus with hyperglycemia: Secondary | ICD-10-CM | POA: Diagnosis not present

## 2024-01-29 LAB — POCT GLYCOSYLATED HEMOGLOBIN (HGB A1C): Hemoglobin A1C: 6.5 % — AB (ref 4.0–5.6)

## 2024-01-29 MED ORDER — MOUNJARO 2.5 MG/0.5ML ~~LOC~~ SOAJ: 2.5000 mg | SUBCUTANEOUS | 2 refills | Status: DC

## 2024-01-29 NOTE — Progress Notes (Signed)
 Established Patient Office Visit  Subjective    Patient ID: Gary Ceresa., male    DOB: Dec 28, 1955  Age: 68 y.o. MRN: [id number]  CC:  Chief Complaint  Patient presents with   Medical Management of Chronic Issues    3 month follow up    HPI Gary Everett. presents for follow up on diabetes.    HLD: -Medications: Nothing, history of statin intolerance - could not tolerate Simvastatin , Pravastatin  due to myalgias  -Last lipid panel: Lipid Panel     Component Value Date/Time   CHOL 211 (H) 02/29/2020 0833   CHOL 221 05/17/2013 0000   TRIG 103 02/29/2020 0833   TRIG 126 05/17/2013 0000   HDL 46 02/29/2020 0833   CHOLHDL 4.6 02/29/2020 0833   VLDL 17.8 01/18/2015 0829   LDLCALC 143 (H) 02/29/2020 0833   LDLCALC 158 05/17/2013 0000   Diabetes, Type 2: -Last A1c 8.7% 1/23 - was in the lower 8's over the summer  -Medications: Metformin  1000 mg BID, Glipizide  5 mg BID and added Mounjaro 2.5 mg weekly at LOV -Had been on Farxiga but was too expensive, had also been on Empagliflozin-linaGliptin in the past but had kidney issues with it -Patient is compliant with the above medications and reports no side effects. Patient is tolerating Mounjaro well.  -Checking BG at home fasting: 150-170 -Exercise: walking -Eye exam: Uncertain due - patient will call to schedule -Foot exam: UTD 2/25 -Microalbumin: UTD 2/25 -Statin: No cannot tolerate  -PNA vaccine: declines all vaccines  -Denies symptoms of hypoglycemia, polyuria, polydipsia, numbness extremities, foot ulcers/trauma.   Health Maintenance: -Blood work UTD -Colon cancer: colonoscopy 10/23, repeat in 7-10 years  -Declines all vaccines   Outpatient Encounter Medications as of 01/29/2024  Medication Sig   fluticasone (FLONASE) 50 MCG/ACT nasal spray Place into both nostrils daily.   glipiZIDE  (GLUCOTROL  XL) 5 MG 24 hr tablet Take 1 tablet (5 mg total) by mouth 2 (two) times daily.   metFORMIN  (GLUCOPHAGE ) 1000 MG tablet Take 1  tablet (1,000 mg total) by mouth 2 (two) times daily with a meal.   tirzepatide (MOUNJARO) 2.5 MG/0.5ML Pen INJECT 2.5 MG SUBCUTANEOUSLY WEEKLY   glucose blood (ONE TOUCH ULTRA TEST) test strip CHECK BLOOD SUGAR ONCE DAILY AND AS NEEDED (dx. DM E11.9) (Patient not taking: Reported on 10/26/2023)   ONETOUCH DELICA LANCETS 33G MISC CHECK BLOOD SUGAR ONCE DAILY AND AS NEEDED (dx. DM E11.9) (Patient not taking: Reported on 10/26/2023)   [DISCONTINUED] pravastatin  (PRAVACHOL ) 40 MG tablet Take 1 tablet (40 mg total) by mouth every evening.   No facility-administered encounter medications on file as of 01/29/2024.    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 Joanette Moynahan   COLONOSCOPY WITH PROPOFOL  N/A 07/10/2022   Procedure: COLONOSCOPY WITH PROPOFOL ;  Surgeon: Selena Daily, MD;  Location: [facility];  Service: Endoscopy;  Laterality: N/A;   EXTRACORPOREAL SHOCK WAVE LITHOTRIPSY Right 12/18/2016  Procedure: EXTRACORPOREAL SHOCK WAVE LITHOTRIPSY (ESWL);  Surgeon: Dustin Gimenez, MD;  Location: [facility];  Service: Urology;  Laterality: Right;   FOOT SURGERY  2009   KIDNEY STONE SURGERY  1979-present   NECK SURGERY     POLYPECTOMY  07/10/2022   Procedure: POLYPECTOMY;  Surgeon: Selena Daily, MD;  Location: [facility];  Service: Endoscopy;;   SEPTOPLASTY  1999   [place]    Family History  Problem Relation Age of Onset   Diabetes Mother    Kidney disease Mother        Renal Insuff   Heart disease Mother        CAD   COPD Mother     Peripheral vascular disease Mother    Hypertension Brother    Lupus Sister    COPD Sister    Mental illness Sister        Bipolar   Kidney Stones Father    Prostate cancer Paternal Uncle    Colon cancer Neg Hx    Hematuria Neg Hx    Renal cancer Neg Hx    Sickle cell anemia Neg Hx     Social History   Socioeconomic History   Marital status: Married    Spouse name: Not on file   Number of children: 1   Years of education: Not on file   Highest education level: Not on file  Occupational History   Occupation: POLICE OFFICER    Employer: [employer]    Comment: [place]  Tobacco Use   Smoking status: Former    Current packs/day: 0.00    Average packs/day: 1 pack/day for 45.0 years (45.0 ttl pk-yrs)    Types: Cigarettes    Start date: 70    Quit date: 2017    Years since quitting: 8.3   Smokeless tobacco: Never  Substance and Sexual Activity   Alcohol use: Yes    Alcohol/week: 7.0 standard drinks of alcohol    Types: 7 Cans of beer per week   Drug use: No   Sexual activity: Never  Other Topics Concern   Not on file  Social History Narrative   Retired from [employer], part-time Water quality scientist and Rec work as of 2016   Remarried 2003   1 daughter from [employer] relationship   Social Drivers of Corporate investment banker Strain: Not on [employer] Insecurity: Not on file  Transportation Needs: Not on file  Physical Activity: Not on file  Stress: Not on file  Social Connections: Not on file  Intimate Partner Violence: Not on file    Review of Systems  Gastrointestinal:  Negative for abdominal pain, heartburn, nausea and vomiting.  All other systems reviewed and are negative.       Objective    BP 124/82 (Cuff Size: Normal)   Pulse 93   Temp 97.8 F (36.6 C) (Oral)   Resp 16   Ht 5\' 6"  (1.676 m)   Wt 152 lb 12.8 oz (69.3 kg)   SpO2 94%   BMI 24.66 kg/m   Physical Exam Constitutional:      Appearance: Normal appearance.   HENT:     Head: Normocephalic and atraumatic.  Eyes:     Conjunctiva/sclera: Conjunctivae normal.  Cardiovascular:     Rate and Rhythm: Normal rate and regular rhythm.  Pulmonary:     Effort: Pulmonary effort is normal.     Breath sounds: Normal breath sounds.  Skin:    General: Skin  is warm and dry.  Neurological:     General: No focal deficit present.     Mental Status: He is alert. Mental status is at baseline.  Psychiatric:        Mood and Affect: Mood normal.        Behavior: Behavior normal.         Assessment & Plan:   Assessment & Plan Type 2 diabetes mellitus, well-controlled A1c improved to 6.5 with Mounjaro. Minimal side effects. Discontinuation of glipizide  advised due to hypoglycemia risk. - Discontinue glipizide . - Continue metformin  1000 mg twice daily. - Continue Mounjaro 2.5 mg weekly. - [facility] [place] - Recheck A1c in 3 months.  - POCT HgB A1C - tirzepatide (MOUNJARO) 2.5 MG/0.5ML Pen; Inject 2.5 mg into the skin once a week.  Dispense: 2 mL; Refill: 2   Return in about 3 months (around 04/30/2024) for follow up on a1c.   Rockney Cid, DO

## 2024-02-22 DIAGNOSIS — E119 Type 2 diabetes mellitus without complications: Secondary | ICD-10-CM | POA: Diagnosis not present

## 2024-02-22 DIAGNOSIS — H25013 Cortical age-related cataract, bilateral: Secondary | ICD-10-CM | POA: Diagnosis not present

## 2024-02-22 DIAGNOSIS — H2513 Age-related nuclear cataract, bilateral: Secondary | ICD-10-CM | POA: Diagnosis not present

## 2024-02-22 LAB — HM DIABETES EYE EXAM

## 2024-03-01 DIAGNOSIS — M720 Palmar fascial fibromatosis [Dupuytren]: Secondary | ICD-10-CM | POA: Diagnosis not present

## 2024-03-08 DIAGNOSIS — L98498 Non-pressure chronic ulcer of skin of other sites with other specified severity: Secondary | ICD-10-CM | POA: Diagnosis not present

## 2024-03-08 DIAGNOSIS — R21 Rash and other nonspecific skin eruption: Secondary | ICD-10-CM | POA: Diagnosis not present

## 2024-03-22 DIAGNOSIS — E119 Type 2 diabetes mellitus without complications: Secondary | ICD-10-CM | POA: Diagnosis not present

## 2024-04-20 DIAGNOSIS — B009 Herpesviral infection, unspecified: Secondary | ICD-10-CM | POA: Diagnosis not present

## 2024-04-22 DIAGNOSIS — E119 Type 2 diabetes mellitus without complications: Secondary | ICD-10-CM | POA: Diagnosis not present

## 2024-05-02 ENCOUNTER — Ambulatory Visit: Admitting: Internal Medicine

## 2024-05-02 ENCOUNTER — Other Ambulatory Visit: Payer: Self-pay

## 2024-05-02 ENCOUNTER — Encounter: Payer: Self-pay | Admitting: Internal Medicine

## 2024-05-02 VITALS — BP 120/76 | HR 85 | Temp 97.9°F | Resp 16 | Ht 66.0 in | Wt 151.6 lb

## 2024-05-02 DIAGNOSIS — E1165 Type 2 diabetes mellitus with hyperglycemia: Secondary | ICD-10-CM | POA: Diagnosis not present

## 2024-05-02 DIAGNOSIS — L239 Allergic contact dermatitis, unspecified cause: Secondary | ICD-10-CM | POA: Diagnosis not present

## 2024-05-02 DIAGNOSIS — Z7985 Long-term (current) use of injectable non-insulin antidiabetic drugs: Secondary | ICD-10-CM | POA: Diagnosis not present

## 2024-05-02 LAB — POCT GLYCOSYLATED HEMOGLOBIN (HGB A1C): Hemoglobin A1C: 7.1 % — AB (ref 4.0–5.6)

## 2024-05-02 MED ORDER — METFORMIN HCL 1000 MG PO TABS: 1000.0000 mg | ORAL_TABLET | Freq: Two times a day (BID) | ORAL | 1 refills | Status: DC

## 2024-05-02 MED ORDER — MOUNJARO 2.5 MG/0.5ML ~~LOC~~ SOAJ: 2.5000 mg | SUBCUTANEOUS | 2 refills | Status: DC

## 2024-05-02 NOTE — Progress Notes (Signed)
 Established Patient Office Visit  Subjective    Patient ID: Gary Everett., male    DOB: 07-07-1956  Age: 68 y.o. MRN: [id number]  CC:  Chief Complaint  Patient presents with   Medical Management of Chronic Issues    HPI [name] presents for follow up on diabetes.    Discussed the use of AI scribe software for clinical note transcription with the patient, who gave verbal consent to proceed.  History of Present Illness Gary Everett. is a 68 year old male with type 2 diabetes who presents for routine follow-up.  His A1c is 7.1, an increase from 6.5 at the last visit but improved from 9.1 six months ago. He is on a low dose of Mounjaro  (2.5 mg) without side effects. He is less active in winter due to being 'cold natured'.  He has small, itchy red spots on his skin and uses cortisone cream for relief. He resumed daily magnesium supplements. There are no changes in soaps or detergents; he uses Safeguard, a non-fragranced soap.  A previous skin lesion was biopsied and ruled out for cancer, suggested to be viral, and was cauterized. It has been healing slowly over two months.   HLD: -Medications: Nothing, history of statin intolerance - could not tolerate Simvastatin , Pravastatin  due to myalgias  -Last lipid panel: Lipid Panel     Component Value Date/Time   CHOL 211 (H) 02/29/2020 0833   CHOL 221 05/17/2013 0000   TRIG 103 02/29/2020 0833   TRIG 126 05/17/2013 0000   HDL 46 02/29/2020 0833   CHOLHDL 4.6 02/29/2020 0833   VLDL 17.8 01/18/2015 0829   LDLCALC 143 (H) 02/29/2020 0833   LDLCALC 158 05/17/2013 0000   Diabetes, Type 2: -Last A1c 6.5% 5/25 -Medications: Metformin  1000 mg BID, Glipizide  5 mg BID and Mounjaro  2.5 mg weekly  -Had been on Farxiga but was too expensive, had also been on Empagliflozin-linaGliptin in the past but had kidney issues with it -Patient is compliant with the above medications and reports no side effects. Patient is tolerating Mounjaro   well.  -Checking BG at home fasting: 150-170 -Exercise: walking -Eye exam: UTD -Foot exam: UTD 2/25 -Microalbumin: UTD 2/25 -Statin: No cannot tolerate  -PNA vaccine: declines all vaccines  -Denies symptoms of hypoglycemia, polyuria, polydipsia, numbness extremities, foot ulcers/trauma.   Health Maintenance: -Blood work UTD -Colon cancer: colonoscopy 10/23, repeat in 7-10 years  -Declines all vaccines   Outpatient Encounter Medications as of 05/02/2024  Medication Sig   fluticasone (FLONASE) 50 MCG/ACT nasal spray Place into both nostrils daily.   metFORMIN  (GLUCOPHAGE ) 1000 MG tablet Take 1 tablet (1,000 mg total) by mouth 2 (two) times daily with a meal.   tirzepatide  (MOUNJARO ) 2.5 MG/0.5ML Pen Inject 2.5 mg into the skin once a week.   [DISCONTINUED] pravastatin  (PRAVACHOL ) 40 MG tablet Take 1 tablet (40 mg total) by mouth every evening.   No facility-administered encounter medications on file as of 05/02/2024.    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 Gaither   COLONOSCOPY WITH PROPOFOL  N/A 07/10/2022   Procedure: COLONOSCOPY WITH PROPOFOL ;  Surgeon: Unk Corinn Skiff, MD;  Location: [facility];  Service: Endoscopy;  Laterality: N/A;   EXTRACORPOREAL SHOCK WAVE LITHOTRIPSY Right 12/18/2016   Procedure: EXTRACORPOREAL SHOCK WAVE LITHOTRIPSY (ESWL);  Surgeon: Rosina Riis, MD;  Location: [facility];  Service: Urology;  Laterality: Right;   FOOT SURGERY  2009   KIDNEY STONE SURGERY  1979-present   NECK SURGERY      POLYPECTOMY  07/10/2022   Procedure: POLYPECTOMY;  Surgeon: Unk Corinn Skiff, MD;  Location: [facility];  Service: Endoscopy;;   SEPTOPLASTY  1999   [place]    Family History  Problem Relation Age of Onset   Diabetes Mother    Kidney disease Mother        Renal Insuff   Heart disease Mother        CAD   COPD Mother    Peripheral vascular disease Mother    Hypertension Brother    Lupus Sister    COPD Sister    Mental illness Sister        Bipolar   Kidney Stones Father    Prostate cancer Paternal Uncle    Colon cancer Neg Hx    Hematuria Neg Hx    Renal cancer Neg Hx    Sickle cell anemia Neg Hx     Social History   Socioeconomic History   Marital status: Married    Spouse name: Not on file   Number of children: 1   Years of education: Not on file   Highest education level: Not on file  Occupational History   Occupation: POLICE OFFICER    Employer: [employer]    Comment: [place]  Tobacco Use   Smoking status: Former    Current packs/day: 0.00    Average packs/day: 1 pack/day for 45.0 years (45.0 ttl pk-yrs)    Types: Cigarettes    Start date: 82    Quit date: 2017    Years since quitting: 8.6   Smokeless tobacco: Never  Substance and Sexual Activity   Alcohol use: Yes    Alcohol/week: 7.0 standard drinks of alcohol    Types: 7 Cans of beer per week   Drug use: No   Sexual activity: Never  Other Topics Concern   Not on file  Social History Narrative   Retired from [employer], part-time Water quality scientist and Rec work as of 2016   Remarried 2003   1 daughter from [employer] relationship   Social Drivers of Corporate investment banker Strain: Not on [employer] Insecurity: Not on file  Transportation Needs: Not on file  Physical Activity: Not on file  Stress: Not on file  Social Connections: Not on file  Intimate Partner Violence: Not on file    Review of Systems  Gastrointestinal:  Negative for abdominal pain,  heartburn, nausea and vomiting.  Skin:  Positive for itching and rash.  All other systems reviewed and are negative.       Objective    BP 120/76 (Cuff Size: Large)   Pulse 85   Temp 97.9 F (36.6 C) (Oral)   Resp 16   Ht 5' 6 (1.676 m)   Wt 151 lb 9.6 oz (68.8 kg)   SpO2 99%   BMI 24.47 kg/m   Physical Exam Constitutional:  Appearance: Normal appearance.  HENT:     Head: Normocephalic and atraumatic.  Eyes:     Conjunctiva/sclera: Conjunctivae normal.  Cardiovascular:     Rate and Rhythm: Normal rate and regular rhythm.  Pulmonary:     Effort: Pulmonary effort is normal.     Breath sounds: Normal breath sounds.  Skin:    General: Skin is warm and dry.     Findings: Rash present.     Comments: Small erythematous patch on right upper abdomen   Neurological:     General: No focal deficit present.     Mental Status: He is alert. Mental status is at baseline.  Psychiatric:        Mood and Affect: Mood normal.        Behavior: Behavior normal.         Assessment & Plan:   Assessment & Plan  Type 2 diabetes mellitus A1c increased to 7.1 from 6.5, improved from 9.1 six months ago. Managed with Mounjaro  2.5 mg. Goal: A1c under 7.5. Patient prefers to delay dose increase due to decreased winter activity. - Continue Mounjaro  2.5 mg. - Continue Metformin .  - Follow-up in December to recheck A1c and consider Mounjaro  dose increase.  Allergic contact dermatitis Intermittent red spots with itching, likely allergic reaction. No changes in soaps or detergents. Spots healing, not widespread. Possible contact dermatitis from unknown allergen. - Use OTC Benadryl  or cortisone cream for itching. - Advise fragrance-free soaps and lotions.  - POCT HgB A1C - tirzepatide  (MOUNJARO ) 2.5 MG/0.5ML Pen; Inject 2.5 mg into the skin once a week.  Dispense: 2 mL; Refill: 2 - metFORMIN  (GLUCOPHAGE ) 1000 MG tablet; Take 1 tablet (1,000 mg total) by mouth 2 (two) times daily with  a meal.  Dispense: 180 tablet; Refill: 1   Return in about 4 months (around 09/01/2024).   Sharyle Fischer, DO

## 2024-05-21 ENCOUNTER — Other Ambulatory Visit: Payer: Self-pay | Admitting: Internal Medicine

## 2024-05-21 DIAGNOSIS — E1165 Type 2 diabetes mellitus with hyperglycemia: Secondary | ICD-10-CM

## 2024-05-23 DIAGNOSIS — E119 Type 2 diabetes mellitus without complications: Secondary | ICD-10-CM | POA: Diagnosis not present

## 2024-05-24 NOTE — Telephone Encounter (Signed)
 Requested medication (s) are due for refill today: yes  Requested medication (s) are on the active medication list: yes  Last refill:  05/02/24 2 ml 2 RF  Future visit scheduled: yes  Notes to clinic:  pt wanting to switch pharmacies since med is not assigned a protocol this cannot be delegated to NT to do   Requested Prescriptions  Pending Prescriptions Disp Refills   MOUNJARO  2.5 MG/0.5ML Pen [Pharmacy Med Name: MOUNJARO  2.5 MG/0.5 ML PEN]  2    Sig: INJECT 2.5 MG SUBCUTANEOUSLY WEEKLY     Off-Protocol Failed - 05/24/2024  9:41 AM      Failed - Medication not assigned to a protocol, review manually.      Passed - Valid encounter within last 12 months    Recent Outpatient Visits           3 weeks ago Type 2 diabetes mellitus with hyperglycemia, without long-term current use of insulin [facility])   [place] [facility] Bernardo Fend, DO   3 months ago Type 2 diabetes mellitus with hyperglycemia, without long-term current use of insulin [facility])   [facility] Bernardo Fend, DO   7 months ago Type 2 diabetes mellitus with hyperglycemia, without long-term current use of insulin [facility])   [facility] Bernardo Fend, DO       Future Appointments             In 3 months Bernardo Fend, DO [facility] [place], [place]

## 2024-05-27 ENCOUNTER — Other Ambulatory Visit: Payer: Self-pay | Admitting: Internal Medicine

## 2024-05-27 ENCOUNTER — Telehealth: Payer: Self-pay

## 2024-05-27 DIAGNOSIS — E1165 Type 2 diabetes mellitus with hyperglycemia: Secondary | ICD-10-CM

## 2024-05-27 MED ORDER — MOUNJARO 2.5 MG/0.5ML ~~LOC~~ SOAJ: 2.5000 mg | SUBCUTANEOUS | 0 refills | Status: DC

## 2024-05-27 NOTE — Telephone Encounter (Signed)
 Copied from CRM [id number]. Topic: Clinical - Medication Question >> May 27, 2024  9:58 AM [name] wrote: Reason for CRM: Patient wants to have his tirzepatide  (MOUNJARO ) 2.5 MG/0.5ML Pen to be sent to the pharmacy with refills already in place. He states he spoke with Dr. Bernardo and it was going to be set up that way. However, upon getting his medication on 05/26/2024 the pharmacy told him it did not come with refills and he needs to call every month to place it himself. Patient does not want to do that as that's not what was talked about for this med

## 2024-05-27 NOTE — Telephone Encounter (Signed)
 Requested change of pharmacy  Requested Prescriptions  Pending Prescriptions Disp Refills   metFORMIN  (GLUCOPHAGE ) 1000 MG tablet [Pharmacy Med Name: METFORMIN  HCL 1,000 MG TABLET] 180 tablet 1    Sig: TAKE 1 TABLET (1,000 MG TOTAL) BY MOUTH TWICE A DAY WITH FOOD     Endocrinology:  Diabetes - Biguanides Failed - 05/27/2024  9:42 AM      Failed - B12 Level in normal range and within 720 days    No results found for: VITAMINB12       Passed - Cr in normal range and within 360 days    Creat  Date Value Ref Range Status  10/26/2023 0.89 0.70 - 1.35 mg/dL Final   Creatinine,U  Date Value Ref Range Status  06/27/2010 21.7 mg/dL Final   Creatinine, Urine  Date Value Ref Range Status  10/26/2023 49 20 - 320 mg/dL Final         Passed - HBA1C is between 0 and 7.9 and within 180 days    Hemoglobin A1C  Date Value Ref Range Status  05/02/2024 7.1 (A) 4.0 - 5.6 % Final   Hgb A1c MFr Bld  Date Value Ref Range Status  10/26/2023 9.1 (H) <5.7 % of total Hgb Final    Comment:    For someone without known diabetes, a hemoglobin A1c value of 6.5% or greater indicates that they may have  diabetes and this should be confirmed with a follow-up  test. . For someone with known diabetes, a value <7% indicates  that their diabetes is well controlled and a value  greater than or equal to 7% indicates suboptimal  control. A1c targets should be individualized based on  duration of diabetes, age, comorbid conditions, and  other considerations. . Currently, no consensus exists regarding use of hemoglobin A1c for diagnosis of diabetes for children. .    A1c  Date Value Ref Range Status  05/17/2013 6.5  Final         Passed - eGFR in normal range and within 360 days    GFR, Est African American  Date Value Ref Range Status  02/29/2020 92 > OR = 60 mL/min/1.47m2 Final   GFR, Est Non African American  Date Value Ref Range Status  02/29/2020 80 > OR = 60 mL/min/1.40m2 Final   GFR   Date Value Ref Range Status  01/18/2015 87.00 >60.00 mL/min Final   eGFR  Date Value Ref Range Status  10/26/2023 93 > OR = 60 mL/min/1.109m2 Final         Passed - Valid encounter within last 6 months    Recent Outpatient Visits           3 weeks ago Type 2 diabetes mellitus with hyperglycemia, without long-term current use of insulin [facility])   [place] [facility] Bern[facility] Fend, DO   3 months ago Type 2 diabetes mellitus with hyperglycemia, without long-term current use of insulin [facility])   [facility] Bernardo Fend, DO   7 months ago Type 2 diabetes mellitus with hyperglycemia, without long-term current use of insulin [facility])   [facility] Bernardo Fend, DO       Future Appointments             In 3 months Bernardo Fend, DO [facility], [place]            Passed - CBC within normal limits and completed in the last 12 months  WBC  Date Value Ref Range Status  10/26/2023 4.7 3.8 - 10.8 Thousand/uL Final   RBC  Date Value Ref Range Status  10/26/2023 4.84 4.20 - 5.80 Million/uL Final   Hemoglobin  Date Value Ref Range Status  10/26/2023 14.6 13.2 - 17.1 g/dL Final   HGB  Date Value Ref Range Status  05/30/2013 13.0 13.0 - 18.0 g/dL Final   HCT  Date Value Ref Range Status  10/26/2023 43.8 38.5 - 50.0 % Final  05/30/2013 36.6 (L) 40.0 - 52.0 % Final   MCHC  Date Value Ref Range Status  10/26/2023 33.3 32.0 - 36.0 g/dL Final    Comment:    For adults, a slight decrease in the calculated MCHC value (in the range of 30 to 32 g/dL) is most likely not clinically significant; however, it should be interpreted with caution in correlation with other red cell parameters and the patient's clinical condition.    [facility]  Date Value Ref Range Status  10/26/2023 30.2 27.0 - 33.0 pg Final   MCV  Date Value Ref Range Status  10/26/2023 90.5 80.0 -  100.0 fL Final  05/30/2013 90 80 - 100 fL Final   No results found for: PLTCOUNTKUC, LABPLAT, POCPLA RDW  Date Value Ref Range Status  10/26/2023 12.3 11.0 - 15.0 % Final  05/30/2013 13.1 11.5 - 14.5 % Final

## 2024-06-20 ENCOUNTER — Other Ambulatory Visit: Payer: Self-pay | Admitting: Internal Medicine

## 2024-06-20 DIAGNOSIS — E1165 Type 2 diabetes mellitus with hyperglycemia: Secondary | ICD-10-CM

## 2024-06-20 NOTE — Telephone Encounter (Signed)
 Am I looking correctly, they are 90 days?

## 2024-06-20 NOTE — Telephone Encounter (Signed)
 Copied from CRM #[id number]. Topic: Clinical - Medication Question >> Jun 20, 2024 10:15 AM Myrick T wrote: Reason for CRM: patient called requesting a call back with an explanation as to why the provider prescribes 1 month at a time every month. He doesn't just want the script sent in he wants an explanation. Insurance advised that the script could be written for up to 

## 2024-06-22 DIAGNOSIS — E119 Type 2 diabetes mellitus without complications: Secondary | ICD-10-CM | POA: Diagnosis not present

## 2024-07-23 DIAGNOSIS — E119 Type 2 diabetes mellitus without complications: Secondary | ICD-10-CM | POA: Diagnosis not present

## 2024-08-15 LAB — HEMOGLOBIN A1C: Hemoglobin A1C: 7.3

## 2024-09-01 ENCOUNTER — Other Ambulatory Visit: Payer: Self-pay

## 2024-09-01 ENCOUNTER — Encounter: Payer: Self-pay | Admitting: Internal Medicine

## 2024-09-01 ENCOUNTER — Ambulatory Visit: Admitting: Internal Medicine

## 2024-09-01 VITALS — BP 124/72 | HR 92 | Temp 97.7°F | Resp 16 | Ht 66.0 in | Wt 150.2 lb

## 2024-09-01 DIAGNOSIS — Z7985 Long-term (current) use of injectable non-insulin antidiabetic drugs: Secondary | ICD-10-CM | POA: Diagnosis not present

## 2024-09-01 DIAGNOSIS — Z7984 Long term (current) use of oral hypoglycemic drugs: Secondary | ICD-10-CM | POA: Diagnosis not present

## 2024-09-01 DIAGNOSIS — E1165 Type 2 diabetes mellitus with hyperglycemia: Secondary | ICD-10-CM

## 2024-09-01 MED ORDER — TIRZEPATIDE 5 MG/0.5ML ~~LOC~~ SOAJ: 5.0000 mg | SUBCUTANEOUS | 1 refills | Status: AC

## 2024-09-01 NOTE — Progress Notes (Signed)
 Established Patient Office Visit  Subjective    Patient ID: Gary Everett., male    DOB: 1956-05-20  Age: 68 y.o. MRN: [id number]  CC:  Chief Complaint  Patient presents with   Medical Management of Chronic Issues    6 month recheck    HPI Gary Everett. presents for follow up on diabetes.    Discussed the use of AI scribe software for clinical note transcription with the patient, who gave verbal consent to proceed.  History of Present Illness  Gary Everett. is a 68 year old male with type 2 diabetes who presents for routine follow-up.  He manages type 2 diabetes with low-dose Mounjaro  and metformin . Home glucose readings have recently been 157 mg/dL and 865 mg/dL. His A1c has risen from 6.5% in May to 7.1% and most recently 7.3%. He attributes this to dietary indiscretions over the holidays and difficulty maintaining diet during that time.  He received a pneumonia vaccine in 2015. He declines influenza vaccination, stating he has not been sick since retiring in 2012 and chooses not to get the flu shot.   HLD: -Medications: Nothing, history of statin intolerance - could not tolerate Simvastatin , Pravastatin  due to myalgias  -Last lipid panel: Lipid Panel     Component Value Date/Time   CHOL 211 (H) 02/29/2020 0833   CHOL 221 05/17/2013 0000   TRIG 103 02/29/2020 0833   TRIG 126 05/17/2013 0000   HDL 46 02/29/2020 0833   CHOLHDL 4.6 02/29/2020 0833   VLDL 17.8 01/18/2015 0829   LDLCALC 143 (H) 02/29/2020 0833   LDLCALC 158 05/17/2013 0000   Diabetes, Type 2: -Last A1c 7.1% 8/25 -Medications: Metformin  1000 mg BID and Mounjaro  2.5 mg weekly - doing well, no side effects -Discontinued sulfonylurea at LOV  -Had been on Farxiga but was too expensive, had also been on Empagliflozin-linaGliptin in the past but had kidney issues with it -Patient is compliant with the above medications and reports no side effects. Patient is tolerating Mounjaro  well.  -Checking BG at home  fasting: 150-170 -Exercise: walking -Eye exam: UTD -Foot exam: UTD 2/25 -Microalbumin: UTD 2/25 -Statin: No cannot tolerate  -PNA vaccine: had in 2015, declines booster -Denies symptoms of hypoglycemia, polyuria, polydipsia, numbness extremities, foot ulcers/trauma.   Health Maintenance: -Blood work UTD -Colon cancer: colonoscopy 10/23, repeat in 7-10 years  -Declines all vaccines   Outpatient Encounter Medications as of 09/01/2024  Medication Sig   fluticasone (FLONASE) 50 MCG/ACT nasal spray Place into both nostrils daily.   metFORMIN  (GLUCOPHAGE ) 1000 MG tablet TAKE 1 TABLET (1,000 MG TOTAL) BY MOUTH TWICE A DAY WITH FOOD   tirzepatide  (MOUNJARO ) 2.5 MG/0.5ML Pen INJECT 2.5 MG SUBCUTANEOUSLY WEEKLY   [DISCONTINUED] pravastatin  (PRAVACHOL ) 40 MG tablet Take 1 tablet (40 mg total) by mouth every evening.   No facility-administered encounter medications on file as of 09/01/2024.    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  Roy   COLONOSCOPY WITH PROPOFOL  N/A 07/10/2022   Procedure: COLONOSCOPY WITH PROPOFOL ;  Surgeon: Unk Gary Skiff, MD;  Location: [facility];  Service: Endoscopy;  Laterality: N/A;   EXTRACORPOREAL SHOCK WAVE LITHOTRIPSY Right 12/18/2016   Procedure: EXTRACORPOREAL SHOCK WAVE LITHOTRIPSY (ESWL);  Surgeon: Rosina Riis, MD;  Location: [facility];  Service: Urology;  Laterality: Right;   FOOT SURGERY  2009   KIDNEY STONE SURGERY  1979-present   NECK SURGERY     POLYPECTOMY  07/10/2022   Procedure:  POLYPECTOMY;  Surgeon: Unk Gary Skiff, MD;  Location: [facility];  Service: Endoscopy;;   SEPTOPLASTY  1999   [place]    Family History  Problem Relation Age of Onset   Diabetes Mother    Kidney disease Mother        Renal Insuff   Heart disease Mother        CAD   COPD Mother    Peripheral vascular disease Mother    Hypertension Brother    Lupus Sister    COPD Sister    Mental illness Sister        Bipolar   Kidney Stones Father    Prostate cancer Paternal Uncle    Colon cancer Neg Hx    Hematuria Neg Hx    Renal cancer Neg Hx    Sickle cell anemia Neg Hx     Social History   Socioeconomic History   Marital status: Married    Spouse name: Not on file   Number of children: 1   Years of education: Not on file   Highest education level: Not on file  Occupational History   Occupation: POLICE OFFICER    Employer: [employer]    Comment: [place]  Tobacco Use   Smoking status: Former    Current packs/day: 0.00    Average packs/day: 1 pack/day for 45.0 years (45.0 ttl pk-yrs)    Types: Cigarettes    Start date: 38    Quit date: 2017    Years since quitting: 8.9   Smokeless tobacco: Never  Substance and Sexual Activity   Alcohol use: Yes    Alcohol/week: 7.0 standard drinks of alcohol    Types: 7 Cans of beer per week   Drug use: No   Sexual activity: Never  Other Topics Concern   Not on file  Social History Narrative   Retired from [employer], part-time Water Quality Scientist and Rec work as of 2016   Remarried 2003   1 daughter from prev relationship   Social Drivers of Health   Tobacco Use: Medium Risk (09/01/2024)   Patient History    Smoking Tobacco Use: Former    Smokeless Tobacco Use: Never    Passive Exposure: Not on [employer] Strain: Not on file  Food Insecurity: Not on file  Transportation Needs: Not on file  Physical Activity: Not on file  Stress: Not on file  Social Connections: Not on file   Intimate Partner Violence: Not on file  Depression (PHQ2-9): Low Risk (09/01/2024)   Depression (PHQ2-9)    PHQ-2 Score: 0  Alcohol Screen: Low Risk (09/01/2024)   Alcohol Screen    Last Alcohol Screening Score (AUDIT): 0  Housing: Not on file  Utilities: Not on file  Health Literacy: Not on file    Review of Systems  Gastrointestinal:  Negative for abdominal pain, constipation, heartburn, nausea and vomiting.  All other systems reviewed and are negative.  Objective    BP 124/72 (Cuff Size: Large)   Pulse 92   Temp 97.7 F (36.5 C) (Oral)   Resp 16   Ht 5' 6 (1.676 m)   Wt 150 lb 3.2 oz (68.1 kg)   SpO2 99%   BMI 24.24 kg/m   Physical Exam Constitutional:      Appearance: Normal appearance.  HENT:     Head: Normocephalic and atraumatic.  Eyes:     Conjunctiva/sclera: Conjunctivae normal.  Cardiovascular:     Rate and Rhythm: Normal rate and regular rhythm.  Pulmonary:     Effort: Pulmonary effort is normal.     Breath sounds: Normal breath sounds.  Skin:    General: Skin is warm and dry.  Neurological:     General: No focal deficit present.     Mental Status: He is alert. Mental status is at baseline.  Psychiatric:        Mood and Affect: Mood normal.        Behavior: Behavior normal.         Assessment & Plan:   Assessment & Plan  Type 2 diabetes mellitus A1c increased to 7.3 from 7.1, likely due to dietary indiscretions. Blood glucose generally well-controlled. Agreed to increase Mounjaro . - Increased Mounjaro  to 5 mg after current supply is finished. - Continue metformin  at current dose. - Scheduled follow-up in 3 months for A1c and other labs.  General Health Maintenance Due for TDAP vaccine, last dose in February 2015. Declined flu vaccine. - Advised obtaining TDAP vaccine at a pharmacy. - Discussed flu vaccine, but he declined.  - tirzepatide  (MOUNJARO ) 5 MG/0.5ML Pen; Inject 5 mg into the skin once a week.  Dispense: 6 mL;  Refill: 1   Return in about 3 months (around 11/30/2024).   Sharyle Fischer, DO

## 2024-09-30 ENCOUNTER — Ambulatory Visit

## 2024-09-30 DIAGNOSIS — Z0000 Encounter for general adult medical examination without abnormal findings: Secondary | ICD-10-CM | POA: Diagnosis not present

## 2024-09-30 NOTE — Patient Instructions (Addendum)
 Gary Everett,  Thank you for taking the time for your Medicare Wellness Visit. I appreciate your continued commitment to your health goals. Please review the care plan we discussed, and feel free to reach out if I can assist you further.  Please note that Annual Wellness Visits do not include a physical exam. Some assessments may be limited, especially if the visit was conducted virtually. If needed, we may recommend an in-person follow-up with your provider.  Ongoing Care Seeing your primary care provider every 3 to 6 months helps us  monitor your health and provide consistent, personalized care. PHYSICAL APPT ON 12/01/24 @ 8:20 AM W/ DR.ANDREWS  Referrals If a referral was made during today's visit and you haven't received any updates within two weeks, please contact the referred provider directly to check on the status.  Recommended Screenings:  Health Maintenance  Topic Date Due   Zoster (Shingles) Vaccine (1 of 2) Never done   Screening for Lung Cancer  11/27/2021   DTaP/Tdap/Td vaccine (3 - Td or Tdap) 11/05/2023   Flu Shot  04/22/2024   COVID-19 Vaccine (1 - 2025-26 season) Never done   Yearly kidney function blood test for diabetes  10/25/2024   Yearly kidney health urinalysis for diabetes  10/25/2024   Pneumococcal Vaccine for age over 46 (2 of 2 - PCV) 10/25/2024*   Complete foot exam   10/25/2024   Hemoglobin A1C  02/12/2025   Eye exam for diabetics  02/21/2025   Medicare Annual Wellness Visit  09/30/2025   Colon Cancer Screening  07/10/2032   Hepatitis C Screening  Completed   Meningitis B Vaccine  Aged Out  *Topic was postponed. The date shown is not the original due date.     Vision: Annual vision screenings are recommended for early detection of glaucoma, cataracts, and diabetic retinopathy. These exams can also reveal signs of chronic conditions such as diabetes and high blood pressure.  Dental: Annual dental screenings help detect early signs of oral cancer, gum  disease, and other conditions linked to overall health, including heart disease and diabetes.  Please see the attached documents for additional preventive care recommendations.   NEXT AWV 10/05/25 @ 10:10 AM IN PERSON

## 2024-09-30 NOTE — Progress Notes (Signed)
 "  Chief Complaint  Patient presents with   Medicare Wellness     Subjective:   Gary Bautch. is a 69 y.o. male who presents for a Medicare Annual Wellness Visit.  Visit info / Clinical Intake: Medicare Wellness Visit Type:: Subsequent Annual Wellness Visit Persons participating in visit and providing information:: patient Medicare Wellness Visit Mode:: Telephone If telephone:: video declined Since this visit was completed virtually, some vitals may be partially provided or unavailable. Missing vitals are due to the limitations of the virtual format.: Unable to obtain vitals - no equipment If Telephone or Video please confirm:: I connected with patient using audio/video enable telemedicine. I verified patient identity with two identifiers, discussed telehealth limitations, and patient agreed to proceed. Patient Location:: HOME Provider Location:: OFFICE Interpreter Needed?: No Pre-visit prep was completed: yes AWV questionnaire completed by patient prior to visit?: no Living arrangements:: lives with spouse/significant other Patient's Overall Health Status Rating: very good Typical amount of pain: some (HANDS) Does pain affect daily life?: no Are you currently prescribed opioids?: no  Dietary Habits and Nutritional Risks How many meals a day?: 2 (EATS WHEN HUNGRY) Eats fruit and vegetables daily?: (!) no (NOT EVERY DAY) Most meals are obtained by: preparing own meals In the last 2 weeks, have you had any of the following?: none Diabetic:: (!) yes Any non-healing wounds?: no How often do you check your BS?: 1 (AT LEAST 5 DAYS PER WEEK) Would you like to be referred to a Nutritionist or for Diabetic Management? : no  Functional Status Activities of Daily Living (to include ambulation/medication): Independent Ambulation: Independent Medication Administration: Independent Home Management (perform basic housework or laundry): Independent Manage your own finances?: yes Primary  transportation is: driving Concerns about vision?: no *vision screening is required for WTM* (BIFOCALS- [place]- YEARLY) Concerns about hearing?: no  Fall Screening Falls in the past year?: 1 Number of falls in past year: 1 Was there an injury with Fall?: 0 Fall Risk Category Calculator: 2 Patient Fall Risk Level: Moderate Fall Risk  Fall Risk Patient at Risk for Falls Due to: History of fall(s) Fall risk Follow up: Falls evaluation completed; Falls prevention discussed  Home and Transportation Safety: All rugs have non-skid backing?: yes All stairs or steps have railings?: yes Grab bars in the bathtub or shower?: (!) no Have non-skid surface in bathtub or shower?: yes Good home lighting?: yes Regular seat belt use?: yes Hospital stays in the last year:: no  Cognitive Assessment Difficulty concentrating, remembering, or making decisions? : yes (MEMORY) Will 6CIT or Mini Cog be Completed: yes What year is it?: 0 points What month is it?: 0 points Give patient an address phrase to remember (5 components): [address] [place], [place] what time is it?: 0 points Count backwards from 20 to 1: 0 points Say the months of the year in reverse: 0 points Repeat the address phrase from earlier: 4 points 6 CIT Score: 4 points  Advance Directives (For Healthcare) Does Patient Have a Medical Advance Directive?: No Would patient like information on creating a medical advance directive?: No - Patient declined  Reviewed/Updated  Reviewed/Updated: Reviewed All (Medical, Surgical, Family, Medications, Allergies, Care Teams, Patient Goals)    Allergies (verified) Contrast media [iodinated contrast media], Pravastatin , and Simvastatin    Current Medications (verified) Outpatient Encounter Medications as of 09/30/2024  Medication Sig   fluticasone (FLONASE) 50 MCG/ACT nasal spray Place into both nostrils daily. (Patient taking differently: Place into both nostrils daily. TAKES  PRN)    metFORMIN  (GLUCOPHAGE ) 1000 MG tablet TAKE 1 TABLET (1,000 MG TOTAL) BY MOUTH TWICE A DAY WITH FOOD   tirzepatide  (MOUNJARO ) 5 MG/0.5ML Pen Inject 5 mg into the skin once a week.   [DISCONTINUED] pravastatin  (PRAVACHOL ) 40 MG tablet Take 1 tablet (40 mg total) by mouth every evening.   No facility-administered encounter medications on file as of 09/30/2024.    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 Gaither   COLONOSCOPY WITH PROPOFOL  N/A 07/10/2022   Procedure: COLONOSCOPY WITH PROPOFOL ;  Surgeon: Unk Corinn Skiff, MD;  Location: [facility];  Service: Endoscopy;  Laterality: N/A;   EXTRACORPOREAL SHOCK WAVE LITHOTRIPSY Right 12/18/2016   Procedure: EXTRACORPOREAL SHOCK WAVE LITHOTRIPSY (ESWL);  Surgeon: Rosina Riis, MD;  Location: [facility];  Service: Urology;  Laterality: Right;   FOOT SURGERY  2009   KIDNEY STONE SURGERY  1979-present   NECK SURGERY     POLYPECTOMY  07/10/2022   Procedure: POLYPECTOMY;  Surgeon: Unk Corinn Skiff, MD;  Location: [facility];  Service: Endoscopy;;   SEPTOPLASTY  1999   [place]   Family History  Problem Relation Age of Onset   Diabetes Mother    Kidney disease Mother        Renal Insuff   Heart disease Mother        CAD   COPD Mother    Peripheral vascular disease Mother    Hypertension Brother    Lupus Sister    COPD Sister    Mental illness Sister        Bipolar   Kidney Stones Father    Prostate cancer Paternal Uncle    Colon cancer Neg Hx    Hematuria Neg Hx     Renal cancer Neg Hx    Sickle cell anemia Neg Hx    Social History   Occupational History   Occupation: POLICE OFFICER    Employer: [employer]    Comment: [place]  Tobacco Use   Smoking status: Former    Current packs/day: 0.00    Average packs/day: 1 pack/day for 45.0 years (45.0 ttl pk-yrs)    Types: Cigarettes    Start date: 64    Quit date: 2017    Years since quitting: 9.0   Smokeless tobacco: Never  Substance and Sexual Activity   Alcohol use: Yes    Alcohol/week: 7.0 standard drinks of alcohol    Types: 7 Cans of beer per week   Drug use: No   Sexual activity: Never   Tobacco Counseling Counseling given: Not Answered  SDOH Screenings   Alcohol Screen: Low Risk (09/01/2024)  Depression (PHQ2-9): Low Risk (09/30/2024)  Physical Activity: Insufficiently Active (09/30/2024)  Stress: No Stress Concern Present (09/30/2024)  Tobacco Use: Medium Risk (09/30/2024)   See flowsheets for full screening details  Depression Screen PHQ 2 & 9 Depression Scale- Over the past 2 weeks, how often have you been bothered by  any of the following problems? Little interest or pleasure in doing things: 0 Feeling down, depressed, or hopeless (PHQ Adolescent also includes...irritable): 0 PHQ-2 Total Score: 0 Trouble falling or staying asleep, or sleeping too much: 0 Feeling tired or having little energy: 0 Poor appetite or overeating (PHQ Adolescent also includes...weight loss): 0 Feeling bad about yourself - or that you are a failure or have let yourself or your family down: 0 Trouble concentrating on things, such as reading the newspaper or watching television (PHQ Adolescent also includes...like school work): 0 Moving or speaking so slowly that other people could have noticed. Or the opposite - being so fidgety or restless that you have been moving around a lot more than usual: 0 Thoughts that you would be better off dead, or of hurting yourself in some way: 0 PHQ-9 Total  Score: 0 If you checked off any problems, how difficult have these problems made it for you to do your work, take care of things at home, or get along with other people?: Not difficult at all  Depression Treatment Depression Interventions/Treatment : EYV7-0 Score <4 Follow-up Not Indicated     Goals Addressed             This Visit's Progress    DIET - EAT MORE FRUITS AND VEGETABLES               Objective:    There were no vitals filed for this visit. There is no height or weight on file to calculate BMI.  Hearing/Vision screen Hearing Screening - Comments:: NO AIDS Vision Screening - Comments:: BIFOCALS- [place]- YEARLY  Immunizations and Health Maintenance Health Maintenance  Topic Date Due   Zoster Vaccines- Shingrix (1 of 2) Never done   Lung Cancer Screening  11/27/2021   DTaP/Tdap/Td (3 - Td or Tdap) 11/05/2023   Influenza Vaccine  04/22/2024   COVID-19 Vaccine (1 - 2025-26 season) Never done   Diabetic kidney evaluation - eGFR measurement  10/25/2024   Diabetic kidney evaluation - Urine ACR  10/25/2024   Pneumococcal Vaccine: 50+ Years (2 of 2 - PCV) 10/25/2024 (Originally 11/04/2014)   FOOT EXAM  10/25/2024   HEMOGLOBIN A1C  02/12/2025   OPHTHALMOLOGY EXAM  02/21/2025   Medicare Annual Wellness (AWV)  09/30/2025   Colonoscopy  07/10/2032   Hepatitis C Screening  Completed   Meningococcal B Vaccine  Aged Out        Assessment/Plan:  This is a routine wellness examination for Gary Everett.  Patient Care Team: Bernardo Fend, DO as PCP - General (Internal Medicine) Sheldon Standing, MD as Consulting Physician (General Surgery) [facility], [facility]  I have personally reviewed and noted the following in the patients chart:   Medical and social history Use of alcohol, tobacco or illicit drugs  Current medications and supplements including opioid prescriptions. Functional ability and status Nutritional status Physical activity Advanced  directives List of other physicians Hospitalizations, surgeries, and ER visits in previous 12 months Vitals Screenings to include cognitive, depression, and falls Referrals and appointments  No orders of the defined types were placed in this encounter.  In addition, I have reviewed and discussed with patient certain preventive protocols, quality metrics, and best practice recommendations. A written personalized care plan for preventive services as well as general preventive health recommendations were provided to patient.   Jhonnie GORMAN Das, LPN   [id number]   Return in 1 year (on 09/30/2025).  After Visit Summary: (MyChart) Due to this being a telephonic  visit, the after visit summary with patients personalized plan was offered to patient via MyChart   Nurse Notes: NEEDS TDAP, SHINGRIX; DECLINES FLU, COVID & PNA SHOTS; UTD ON COLONOSCOPY; DECLINES LUNG CA SCREENING (WANTS ONE Q4-5 YEARS)     "

## 2024-12-01 ENCOUNTER — Encounter: Admitting: Internal Medicine

## 2025-10-05 ENCOUNTER — Ambulatory Visit
